# Patient Record
Sex: Male | Born: 1965 | Race: White | Hispanic: No | Smoking: Former smoker
Health system: Southern US, Community
[De-identification: ages and names within clinical notes are randomized; demographics above are authoritative.]

## PROBLEM LIST (undated history)

## (undated) DIAGNOSIS — I10 Essential (primary) hypertension: Secondary | ICD-10-CM

## (undated) DIAGNOSIS — E785 Hyperlipidemia, unspecified: Secondary | ICD-10-CM

## (undated) DIAGNOSIS — F329 Major depressive disorder, single episode, unspecified: Secondary | ICD-10-CM

## (undated) DIAGNOSIS — F419 Anxiety disorder, unspecified: Secondary | ICD-10-CM

## (undated) DIAGNOSIS — F32A Depression, unspecified: Secondary | ICD-10-CM

## (undated) HISTORY — DX: Major depressive disorder, single episode, unspecified: F32.9

## (undated) HISTORY — PX: JOINT REPLACEMENT: SHX530

## (undated) HISTORY — DX: Depression, unspecified: F32.A

## (undated) HISTORY — PX: TONSILLECTOMY: SUR1361

## (undated) HISTORY — DX: Hyperlipidemia, unspecified: E78.5

---

## 2012-10-25 ENCOUNTER — Other Ambulatory Visit: Payer: Self-pay | Admitting: Orthopedic Surgery

## 2012-10-25 MED ORDER — BUPIVACAINE LIPOSOME 1.3 % IJ SUSP: 20.0000 mL | Freq: Once | INTRAMUSCULAR | Status: DC

## 2012-10-25 MED ORDER — DEXAMETHASONE SODIUM PHOSPHATE 10 MG/ML IJ SOLN: 10.0000 mg | Freq: Once | INTRAMUSCULAR | Status: DC

## 2012-10-25 NOTE — Progress Notes (Signed)
Preoperative surgical orders have been place into the Epic hospital system for [facility] on 10/25/2012, 7:38 AM  by Patrica Duel for surgery on 12/27/12.  Preop Total Hip - Anterior Approach orders including Experel Injecion, IV Tylenol, and IV Decadron as long as there are no contraindications to the above medications. Avel Peace, PA-C

## 2012-12-13 ENCOUNTER — Encounter (HOSPITAL_COMMUNITY): Payer: Self-pay | Admitting: Pharmacy Technician

## 2012-12-19 ENCOUNTER — Encounter (HOSPITAL_COMMUNITY): Payer: Self-pay

## 2012-12-19 ENCOUNTER — Encounter (HOSPITAL_COMMUNITY)
Admission: RE | Admit: 2012-12-19 | Discharge: 2012-12-19 | Disposition: A | Discharge status: Home / Self Care | Payer: BC Managed Care – PPO | Source: Ambulatory Visit | Attending: Orthopedic Surgery | Admitting: Orthopedic Surgery

## 2012-12-19 DIAGNOSIS — Z01812 Encounter for preprocedural laboratory examination: Secondary | ICD-10-CM | POA: Insufficient documentation

## 2012-12-19 DIAGNOSIS — Z01818 Encounter for other preprocedural examination: Secondary | ICD-10-CM | POA: Insufficient documentation

## 2012-12-19 HISTORY — DX: Essential (primary) hypertension: I10

## 2012-12-19 HISTORY — DX: Anxiety disorder, unspecified: F41.9

## 2012-12-19 LAB — COMPREHENSIVE METABOLIC PANEL
Albumin: 4.4 g/dL (ref 3.5–5.2)
BUN: 10 mg/dL (ref 6–23)
CO2: 32 mEq/L (ref 19–32)
Calcium: 9.7 mg/dL (ref 8.4–10.5)
Chloride: 100 mEq/L (ref 96–112)
Creatinine, Ser: 0.95 mg/dL (ref 0.50–1.35)
GFR calc non Af Amer: >90 mL/min (ref >90)
Total Bilirubin: 0.4 mg/dL (ref 0.3–1.2)

## 2012-12-19 LAB — CBC
HCT: 42.1 % (ref 39.0–52.0)
MCH: 31.3 pg (ref 26.0–34.0)
MCV: 86.8 fL (ref 78.0–100.0)
RDW: 12.6 % (ref 11.5–15.5)
WBC: 7.7 10*3/uL (ref 4.0–10.5)

## 2012-12-19 LAB — PROTIME-INR
INR: 1.03 (ref 0.00–1.49)
Prothrombin Time: 13.4 seconds (ref 11.6–15.2)

## 2012-12-19 NOTE — Progress Notes (Addendum)
STATES HE HAD EKG AT DR. 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

## 2012-12-19 NOTE — Pre-Procedure Instructions (Signed)
Cameron Nelson  12/19/2012   Your procedure is scheduled on:  Friday, March 7th   Report to [facility] at  10:15  AM.  Call this number if you have problems the morning of surgery: [phone]   Remember:   Do not eat food or drink liquids after midnight Thursday.   Take these medicines the morning of surgery with A SIP OF WATER: Pain medication, ativan   Do not wear jewelry.  Do not wear lotions, powders, or colognes. You may NOT wear deodorant.    Men may shave face and neck.   Do not bring valuables to the hospital.  Contacts, dentures or bridgework may not be worn into surgery.   Leave suitcase in the car. After surgery it may be brought to your room.  For patients admitted to the hospital, checkout time is 11:00 AM the day of discharge.   Patients discharged the day of surgery will not be allowed to drive home.  Name and phone number of your driver: [name] --- SPOUSE   Special Instructions: Shower using CHG 2 nights before surgery and the night before surgery.  If you shower the day of surgery use CHG.  Use special wash - you have one bottle of CHG for all showers.  You should use approximately 1/3 of the bottle for each shower.   Please read over the following fact sheets that you were given: Pain Booklet, Coughing and Deep Breathing, Blood Transfusion Information, MRSA Information and Surgical Site Infection Prevention

## 2012-12-22 NOTE — H&P (Signed)
TOTAL HIP ADMISSION H&P  Patient is admitted for left total hip arthroplasty.  Subjective:  Chief Complaint: left hip pain  HPI: Cameron Nelson, 47 y.o. male, has a history of pain and functional disability in the left hip(s) due to arthritis and patient has failed non-surgical conservative treatments for greater than 12 weeks to include NSAID's and/or analgesics, corticosteriod injections, flexibility and strengthening excercises and activity modification.  Onset of symptoms was gradual starting 3 years ago with gradually worsening course since that time.The patient noted no past surgery on the left hip(s).  Patient currently rates pain in the left hip at 7 out of 10 with activity. Patient has night pain, worsening of pain with activity and weight bearing, pain that interfers with activities of daily living and pain with passive range of motion. Patient has evidence of periarticular osteophytes and joint space narrowing by imaging studies. This condition presents safety issues increasing the risk of falls. There is no current active infection.  Past Medical History  Diagnosis Date  . Hypertension   . Anxiety     Past Surgical History  Procedure Laterality Date  . Tonsillectomy       Current outpatient prescriptions benazepril (LOTENSIN) 20 MG tablet, Take 20 mg by mouth daily., Disp: , Rfl: ;  HYDROCODONE-ACETAMINOPHEN PO, Take 1 tablet by mouth at bedtime as needed (for pain). [facility] in [place] did not have a record of this med, Disp: , Rfl: ;   LORazepam (ATIVAN) 1 MG tablet, Take 1 mg by mouth every 8 (eight) hours., Disp: , Rfl: ;  rosuvastatin (CRESTOR) 10 MG tablet, Take 10 mg by mouth daily., Disp: , Rfl:  traMADol (ULTRAM) 50 MG tablet, Take 50 mg by mouth every 6 (six) hours as needed for pain. Patient uses 100 mg in the morning, and 100 mg in the evening., Disp: , Rfl: ;   Vortioxetine HBr (BRINTELLIX) 20 MG TABS, Take 20 mg by mouth daily., Disp: , Rfl:   No Known Allergies   History  Substance Use Topics  . Smoking status: Former Smoker -- 0.50 packs/day for 3 years    Types: Cigarettes  . Smokeless tobacco: Former Neurosurgeon    Quit date: 12/19/2004  . Alcohol Use: Not on file    Family History Father deceased age 20 due to Mi; triple CABG Mother living age 56; HTN Grandmother deceased due to CHF  Review of Systems  Constitutional: Negative.   HENT: Negative.  Negative for neck pain.   Eyes: Negative.   Respiratory: Negative.   Cardiovascular: Negative.   Gastrointestinal: Negative.   Genitourinary: Negative.   Musculoskeletal: Positive for back pain and joint pain. Negative for myalgias and falls.       Left hip pain  Skin: Positive for itching.       Medication side effect  Neurological: Negative.   Endo/Heme/Allergies: Negative.   Psychiatric/Behavioral: Negative.     Objective:  Physical Exam  Constitutional: He is oriented to person, place, and time. He appears well-developed and well-nourished. No distress.  HENT:  Head: Normocephalic and atraumatic.  Right Ear: External ear normal.  Left Ear: External ear normal.  Nose: Nose normal.  Mouth/Throat: Oropharynx is clear and moist.  Eyes: Conjunctivae are normal.  Neck: Normal range of motion. Neck supple. No tracheal deviation present. No thyromegaly present.  Cardiovascular: Normal rate, regular rhythm, normal heart sounds and intact distal pulses.   No murmur heard. Respiratory: Effort normal and breath sounds normal. No respiratory distress. He has no wheezes.  He exhibits no tenderness.  GI: Soft. Bowel sounds are normal. He exhibits no distension and no mass. There is no tenderness.  Musculoskeletal:       Right hip: He exhibits decreased range of motion.       Left hip: He exhibits decreased range of motion and decreased strength.       Right knee: Normal.       Left knee: Normal.       Right lower leg: He exhibits no tenderness and no swelling.       Left lower leg: He exhibits  no tenderness and no swelling.  Evaluation of his right hip is flexion to 95, rotation in 10, out 30, abduction 30 without discomfort. Left hip flexion 80, no internal or external rotation and about 5 degrees abduction. Knee exams are normal. He walks with a significantly antalgic gait on the left hip.  Lymphadenopathy:    He has no cervical adenopathy.  Neurological: He is alert and oriented to person, place, and time. He has normal strength and normal reflexes. No sensory deficit.  Skin: No rash noted. He is not diaphoretic. No erythema.  Psychiatric: He has a normal mood and affect. His behavior is normal.   Vitals Weight: 212 lb Height: 74 in Body Surface Area: 2.24 m Body Mass Index: 27.22 kg/m Pulse: 64 (Regular) Resp.: 16 (Unlabored) BP: 124/78 (Sitting, Left Arm, Standard)   Imaging Review Plain radiographs demonstrate severe degenerative joint disease of the left hip(s). The bone quality appears to be good for age and reported activity level.  Assessment/Plan:  End stage arthritis, left hip(s)  The patient history, physical examination, clinical judgement of the provider and imaging studies are consistent with end stage degenerative joint disease of the left hip(s) and total hip arthroplasty is deemed medically necessary. The treatment options including medical management, injection therapy, arthroscopy and arthroplasty were discussed at length. The risks and benefits of total hip arthroplasty were presented and reviewed. The risks due to aseptic loosening, infection, stiffness, dislocation/subluxation,  thromboembolic complications and other imponderables were discussed.  The patient acknowledged the explanation, agreed to proceed with the plan and consent was signed. Patient is being admitted for inpatient treatment for surgery, pain control, PT, OT, prophylactic antibiotics, VTE prophylaxis, progressive ambulation and ADL's and discharge planning.The patient is  planning to be discharged home with home health services     [place], [place]

## 2012-12-26 MED ORDER — CEFAZOLIN SODIUM-DEXTROSE 2-3 GM-% IV SOLR
2.0000 g | INTRAVENOUS | Status: AC
  Administered 2012-12-27: 2 g via INTRAVENOUS

## 2012-12-27 ENCOUNTER — Inpatient Hospital Stay (HOSPITAL_COMMUNITY): Payer: BC Managed Care – PPO

## 2012-12-27 ENCOUNTER — Encounter (HOSPITAL_COMMUNITY): Payer: Self-pay | Admitting: Vascular Surgery

## 2012-12-27 ENCOUNTER — Encounter (HOSPITAL_COMMUNITY): Payer: Self-pay

## 2012-12-27 ENCOUNTER — Encounter (HOSPITAL_COMMUNITY)
Admission: RE | Disposition: A | Discharge status: Home Health | Payer: Self-pay | Source: Ambulatory Visit | Attending: Orthopedic Surgery

## 2012-12-27 ENCOUNTER — Inpatient Hospital Stay (HOSPITAL_COMMUNITY): Payer: BC Managed Care – PPO | Admitting: Anesthesiology

## 2012-12-27 ENCOUNTER — Inpatient Hospital Stay (HOSPITAL_COMMUNITY)
Admission: RE | Admit: 2012-12-27 | Discharge: 2012-12-29 | DRG: 818 | Disposition: A | Discharge status: Home Health | Payer: BC Managed Care – PPO | Source: Ambulatory Visit | Attending: Orthopedic Surgery | Admitting: Orthopedic Surgery

## 2012-12-27 DIAGNOSIS — I10 Essential (primary) hypertension: Secondary | ICD-10-CM | POA: Diagnosis present

## 2012-12-27 DIAGNOSIS — Z79899 Other long term (current) drug therapy: Secondary | ICD-10-CM

## 2012-12-27 DIAGNOSIS — F411 Generalized anxiety disorder: Secondary | ICD-10-CM | POA: Diagnosis present

## 2012-12-27 DIAGNOSIS — Z87891 Personal history of nicotine dependence: Secondary | ICD-10-CM

## 2012-12-27 DIAGNOSIS — Z8249 Family history of ischemic heart disease and other diseases of the circulatory system: Secondary | ICD-10-CM

## 2012-12-27 DIAGNOSIS — M169 Osteoarthritis of hip, unspecified: Secondary | ICD-10-CM | POA: Diagnosis present

## 2012-12-27 DIAGNOSIS — F1210 Cannabis abuse, uncomplicated: Secondary | ICD-10-CM | POA: Diagnosis present

## 2012-12-27 DIAGNOSIS — Z9089 Acquired absence of other organs: Secondary | ICD-10-CM

## 2012-12-27 DIAGNOSIS — M1610 Unilateral primary osteoarthritis, unspecified hip: Principal | ICD-10-CM | POA: Diagnosis present

## 2012-12-27 HISTORY — PX: TOTAL HIP ARTHROPLASTY: SHX124

## 2012-12-27 SURGERY — ARTHROPLASTY, HIP, TOTAL, ANTERIOR APPROACH
Anesthesia: General | Site: Hip | Laterality: Left | Wound class: Clean

## 2012-12-27 MED ORDER — SODIUM CHLORIDE 0.9 % IV SOLN: INTRAVENOUS | Status: DC

## 2012-12-27 MED ORDER — KCL IN DEXTROSE-NACL 20-5-0.45 MEQ/L-%-% IV SOLN
INTRAVENOUS | Status: AC
  Administered 2012-12-27: 1000 mL
  Filled 2012-12-27: qty 1000

## 2012-12-27 MED ORDER — DEXAMETHASONE 6 MG PO TABS
10.0000 mg | ORAL_TABLET | Freq: Once | ORAL | Status: AC
  Administered 2012-12-28: 10 mg via ORAL
  Filled 2012-12-27: qty 1

## 2012-12-27 MED ORDER — ONDANSETRON HCL 4 MG/2ML IJ SOLN
INTRAMUSCULAR | Status: DC | PRN
  Administered 2012-12-27: 4 mg via INTRAVENOUS

## 2012-12-27 MED ORDER — 0.9 % SODIUM CHLORIDE (POUR BTL) OPTIME
TOPICAL | Status: DC | PRN
  Administered 2012-12-27: 1000 mL

## 2012-12-27 MED ORDER — ACETAMINOPHEN 10 MG/ML IV SOLN
1000.0000 mg | Freq: Once | INTRAVENOUS | Status: DC
  Filled 2012-12-27: qty 100

## 2012-12-27 MED ORDER — FENTANYL CITRATE 0.05 MG/ML IJ SOLN
INTRAMUSCULAR | Status: DC | PRN
  Administered 2012-12-27 (×2): 50 ug via INTRAVENOUS
  Administered 2012-12-27: 100 ug via INTRAVENOUS
  Administered 2012-12-27: 150 ug via INTRAVENOUS
  Administered 2012-12-27 (×2): 50 ug via INTRAVENOUS

## 2012-12-27 MED ORDER — VORTIOXETINE HBR 20 MG PO TABS: 20.0000 mg | ORAL_TABLET | Freq: Every day | ORAL | Status: DC

## 2012-12-27 MED ORDER — CEFAZOLIN SODIUM 1-5 GM-% IV SOLN
INTRAVENOUS | Status: AC
  Filled 2012-12-27: qty 100

## 2012-12-27 MED ORDER — METHOCARBAMOL 100 MG/ML IJ SOLN
500.0000 mg | Freq: Four times a day (QID) | INTRAVENOUS | Status: DC | PRN
  Filled 2012-12-27: qty 5

## 2012-12-27 MED ORDER — SODIUM CHLORIDE 0.9 % IJ SOLN
INTRAMUSCULAR | Status: DC | PRN
  Administered 2012-12-27: 50 mL

## 2012-12-27 MED ORDER — HYDROMORPHONE HCL PF 1 MG/ML IJ SOLN
INTRAMUSCULAR | Status: AC
  Filled 2012-12-27: qty 1

## 2012-12-27 MED ORDER — ACETAMINOPHEN 325 MG PO TABS: 650.0000 mg | ORAL_TABLET | Freq: Four times a day (QID) | ORAL | Status: DC | PRN

## 2012-12-27 MED ORDER — ONDANSETRON HCL 4 MG/2ML IJ SOLN: 4.0000 mg | Freq: Four times a day (QID) | INTRAMUSCULAR | Status: DC | PRN

## 2012-12-27 MED ORDER — LACTATED RINGERS IV SOLN
INTRAVENOUS | Status: DC
  Administered 2012-12-27: 12:00:00 via INTRAVENOUS

## 2012-12-27 MED ORDER — DOCUSATE SODIUM 100 MG PO CAPS
100.0000 mg | ORAL_CAPSULE | Freq: Two times a day (BID) | ORAL | Status: DC
  Administered 2012-12-27 – 2012-12-29 (×4): 100 mg via ORAL
  Filled 2012-12-27 (×6): qty 1

## 2012-12-27 MED ORDER — MORPHINE SULFATE 2 MG/ML IJ SOLN
1.0000 mg | INTRAMUSCULAR | Status: DC | PRN
  Administered 2012-12-27 – 2012-12-28 (×2): 2 mg via INTRAVENOUS
  Filled 2012-12-27 (×2): qty 1

## 2012-12-27 MED ORDER — FENTANYL CITRATE 0.05 MG/ML IJ SOLN: 50.0000 ug | Freq: Once | INTRAMUSCULAR | Status: DC

## 2012-12-27 MED ORDER — HYDROMORPHONE HCL PF 1 MG/ML IJ SOLN
0.2500 mg | INTRAMUSCULAR | Status: DC | PRN
  Administered 2012-12-27 (×4): 0.5 mg via INTRAVENOUS

## 2012-12-27 MED ORDER — ACETAMINOPHEN 650 MG RE SUPP: 650.0000 mg | Freq: Four times a day (QID) | RECTAL | Status: DC | PRN

## 2012-12-27 MED ORDER — DEXTROSE 5 % IV SOLN
INTRAVENOUS | Status: DC | PRN
  Administered 2012-12-27 (×2): via INTRAVENOUS

## 2012-12-27 MED ORDER — MIDAZOLAM HCL 2 MG/2ML IJ SOLN: 1.0000 mg | INTRAMUSCULAR | Status: DC | PRN

## 2012-12-27 MED ORDER — ACETAMINOPHEN 10 MG/ML IV SOLN
INTRAVENOUS | Status: AC
  Filled 2012-12-27: qty 100

## 2012-12-27 MED ORDER — GLYCOPYRROLATE 0.2 MG/ML IJ SOLN
INTRAMUSCULAR | Status: DC | PRN
  Administered 2012-12-27: 0.4 mg via INTRAVENOUS

## 2012-12-27 MED ORDER — BUPIVACAINE LIPOSOME 1.3 % IJ SUSP
20.0000 mL | Freq: Once | INTRAMUSCULAR | Status: AC
  Administered 2012-12-27: 20 mL
  Filled 2012-12-27: qty 20

## 2012-12-27 MED ORDER — PHENOL 1.4 % MT LIQD: 1.0000 | OROMUCOSAL | Status: DC | PRN

## 2012-12-27 MED ORDER — BISACODYL 10 MG RE SUPP: 10.0000 mg | Freq: Every day | RECTAL | Status: DC | PRN

## 2012-12-27 MED ORDER — RIVAROXABAN 10 MG PO TABS
10.0000 mg | ORAL_TABLET | Freq: Every day | ORAL | Status: DC
  Administered 2012-12-28: 10 mg via ORAL
  Filled 2012-12-27 (×3): qty 1

## 2012-12-27 MED ORDER — PROPOFOL 10 MG/ML IV BOLUS
INTRAVENOUS | Status: DC | PRN
  Administered 2012-12-27: 200 mg via INTRAVENOUS

## 2012-12-27 MED ORDER — PROMETHAZINE HCL 25 MG/ML IJ SOLN: 6.2500 mg | INTRAMUSCULAR | Status: DC | PRN

## 2012-12-27 MED ORDER — LABETALOL HCL 5 MG/ML IV SOLN
INTRAVENOUS | Status: DC | PRN
  Administered 2012-12-27: 10 mg via INTRAVENOUS
  Administered 2012-12-27 (×2): 5 mg via INTRAVENOUS

## 2012-12-27 MED ORDER — METHOCARBAMOL 500 MG PO TABS
500.0000 mg | ORAL_TABLET | Freq: Four times a day (QID) | ORAL | Status: DC | PRN
  Administered 2012-12-27 – 2012-12-29 (×7): 500 mg via ORAL
  Filled 2012-12-27 (×7): qty 1

## 2012-12-27 MED ORDER — LACTATED RINGERS IV SOLN
INTRAVENOUS | Status: DC | PRN
  Administered 2012-12-27 (×3): via INTRAVENOUS

## 2012-12-27 MED ORDER — CHLORHEXIDINE GLUCONATE 4 % EX LIQD: 60.0000 mL | Freq: Once | CUTANEOUS | Status: DC

## 2012-12-27 MED ORDER — METOCLOPRAMIDE HCL 10 MG PO TABS: 5.0000 mg | ORAL_TABLET | Freq: Three times a day (TID) | ORAL | Status: DC | PRN

## 2012-12-27 MED ORDER — BENAZEPRIL HCL 20 MG PO TABS
20.0000 mg | ORAL_TABLET | Freq: Every day | ORAL | Status: DC
  Administered 2012-12-27 – 2012-12-29 (×3): 20 mg via ORAL
  Filled 2012-12-27 (×3): qty 1

## 2012-12-27 MED ORDER — OXYCODONE HCL 5 MG PO TABS
5.0000 mg | ORAL_TABLET | ORAL | Status: DC | PRN
  Administered 2012-12-28 – 2012-12-29 (×9): 10 mg via ORAL
  Filled 2012-12-27 (×9): qty 2

## 2012-12-27 MED ORDER — VORTIOXETINE HBR 20 MG PO TABS
20.0000 mg | ORAL_TABLET | Freq: Every day | ORAL | Status: DC
  Filled 2012-12-27 (×2): qty 1

## 2012-12-27 MED ORDER — ACETAMINOPHEN 10 MG/ML IV SOLN
1000.0000 mg | Freq: Four times a day (QID) | INTRAVENOUS | Status: AC
  Administered 2012-12-27 – 2012-12-28 (×4): 1000 mg via INTRAVENOUS
  Filled 2012-12-27 (×4): qty 100

## 2012-12-27 MED ORDER — LIDOCAINE HCL 4 % MT SOLN
OROMUCOSAL | Status: DC | PRN
  Administered 2012-12-27: 4 mL via TOPICAL

## 2012-12-27 MED ORDER — NEOSTIGMINE METHYLSULFATE 1 MG/ML IJ SOLN
INTRAMUSCULAR | Status: DC | PRN
  Administered 2012-12-27: 3 mg via INTRAVENOUS

## 2012-12-27 MED ORDER — DEXAMETHASONE SODIUM PHOSPHATE 10 MG/ML IJ SOLN
10.0000 mg | Freq: Once | INTRAMUSCULAR | Status: AC
  Filled 2012-12-27: qty 1

## 2012-12-27 MED ORDER — MIDAZOLAM HCL 5 MG/5ML IJ SOLN
INTRAMUSCULAR | Status: DC | PRN
  Administered 2012-12-27: 2 mg via INTRAVENOUS

## 2012-12-27 MED ORDER — LORAZEPAM 1 MG PO TABS
1.0000 mg | ORAL_TABLET | Freq: Three times a day (TID) | ORAL | Status: DC
  Administered 2012-12-27 – 2012-12-29 (×5): 1 mg via ORAL
  Filled 2012-12-27 (×6): qty 1

## 2012-12-27 MED ORDER — ONDANSETRON HCL 4 MG PO TABS: 4.0000 mg | ORAL_TABLET | Freq: Four times a day (QID) | ORAL | Status: DC | PRN

## 2012-12-27 MED ORDER — KCL IN DEXTROSE-NACL 20-5-0.9 MEQ/L-%-% IV SOLN
INTRAVENOUS | Status: DC
  Administered 2012-12-28: 04:00:00 via INTRAVENOUS
  Filled 2012-12-27 (×7): qty 1000

## 2012-12-27 MED ORDER — ATORVASTATIN CALCIUM 20 MG PO TABS
20.0000 mg | ORAL_TABLET | Freq: Every day | ORAL | Status: DC
  Administered 2012-12-27 – 2012-12-28 (×2): 20 mg via ORAL
  Filled 2012-12-27 (×3): qty 1

## 2012-12-27 MED ORDER — ROCURONIUM BROMIDE 100 MG/10ML IV SOLN
INTRAVENOUS | Status: DC | PRN
  Administered 2012-12-27: 50 mg via INTRAVENOUS

## 2012-12-27 MED ORDER — ACETAMINOPHEN 10 MG/ML IV SOLN
INTRAVENOUS | Status: DC | PRN
  Administered 2012-12-27: 1000 mg via INTRAVENOUS

## 2012-12-27 MED ORDER — FLEET ENEMA 7-19 GM/118ML RE ENEM: 1.0000 | ENEMA | Freq: Once | RECTAL | Status: AC | PRN

## 2012-12-27 MED ORDER — DIPHENHYDRAMINE HCL 12.5 MG/5ML PO ELIX: 12.5000 mg | ORAL_SOLUTION | ORAL | Status: DC | PRN

## 2012-12-27 MED ORDER — POLYETHYLENE GLYCOL 3350 17 G PO PACK: 17.0000 g | PACK | Freq: Every day | ORAL | Status: DC | PRN

## 2012-12-27 MED ORDER — METOCLOPRAMIDE HCL 5 MG/ML IJ SOLN: 5.0000 mg | Freq: Three times a day (TID) | INTRAMUSCULAR | Status: DC | PRN

## 2012-12-27 MED ORDER — LIDOCAINE HCL (CARDIAC) 20 MG/ML IV SOLN
INTRAVENOUS | Status: DC | PRN
  Administered 2012-12-27: 100 mg via INTRAVENOUS

## 2012-12-27 MED ORDER — TRAMADOL HCL 50 MG PO TABS: 50.0000 mg | ORAL_TABLET | Freq: Four times a day (QID) | ORAL | Status: DC | PRN

## 2012-12-27 MED ORDER — MORPHINE SULFATE 2 MG/ML IJ SOLN
INTRAMUSCULAR | Status: AC
  Administered 2012-12-27: 2 mg via INTRAMUSCULAR
  Filled 2012-12-27: qty 1

## 2012-12-27 MED ORDER — MENTHOL 3 MG MT LOZG: 1.0000 | LOZENGE | OROMUCOSAL | Status: DC | PRN

## 2012-12-27 MED ORDER — CEFAZOLIN SODIUM-DEXTROSE 2-3 GM-% IV SOLR
2.0000 g | Freq: Four times a day (QID) | INTRAVENOUS | Status: AC
  Administered 2012-12-27 – 2012-12-28 (×2): 2 g via INTRAVENOUS
  Filled 2012-12-27 (×2): qty 50

## 2012-12-27 SURGICAL SUPPLY — 40 items
BLADE SAW SGTL 18X1.27X75 (BLADE) ×2 IMPLANT
CLOSURE STERI-STRIP 1/4X4 (GAUZE/BANDAGES/DRESSINGS) ×2 IMPLANT
CLOTH BEACON ORANGE TIMEOUT ST (SAFETY) ×2 IMPLANT
CLSR STERI-STRIP ANTIMIC 1/2X4 (GAUZE/BANDAGES/DRESSINGS) ×4 IMPLANT
DECANTER SPIKE VIAL GLASS SM (MISCELLANEOUS) ×2 IMPLANT
DRAPE C-ARM 42X72 X-RAY (DRAPES) ×2 IMPLANT
DRAPE STERI IOBAN 125X83 (DRAPES) ×2 IMPLANT
DRAPE U-SHAPE 47X51 STRL (DRAPES) ×6 IMPLANT
DRSG ADAPTIC 3X8 NADH LF (GAUZE/BANDAGES/DRESSINGS) ×2 IMPLANT
DRSG MEPILEX BORDER 4X4 (GAUZE/BANDAGES/DRESSINGS) IMPLANT
DRSG MEPILEX BORDER 4X8 (GAUZE/BANDAGES/DRESSINGS) ×2 IMPLANT
DURAPREP 26ML APPLICATOR (WOUND CARE) ×2 IMPLANT
ELECT BLADE 6.5 EXT (BLADE) ×2 IMPLANT
ELECT REM PT RETURN 9FT ADLT (ELECTROSURGICAL) ×2
ELECTRODE REM PT RTRN 9FT ADLT (ELECTROSURGICAL) ×1 IMPLANT
EVACUATOR 1/8 PVC DRAIN (DRAIN) IMPLANT
FACESHIELD LNG OPTICON STERILE (SAFETY) ×8 IMPLANT
GLOVE BIO SURGEON STRL SZ8 (GLOVE) ×4 IMPLANT
GLOVE BIOGEL PI IND STRL 8 (GLOVE) ×1 IMPLANT
GLOVE BIOGEL PI INDICATOR 8 (GLOVE) ×1
GLOVE ECLIPSE 8.0 STRL XLNG CF (GLOVE) ×2 IMPLANT
GOWN BRE IMP PREV XXLGXLNG (GOWN DISPOSABLE) ×4 IMPLANT
GOWN STRL NON-REIN LRG LVL3 (GOWN DISPOSABLE) ×2 IMPLANT
KIT BASIN OR (CUSTOM PROCEDURE TRAY) ×2 IMPLANT
NDL SAFETY ECLIPSE 18X1.5 (NEEDLE) ×1 IMPLANT
NEEDLE HYPO 18GX1.5 SHARP (NEEDLE) ×1
PACK TOTAL JOINT (CUSTOM PROCEDURE TRAY) ×2 IMPLANT
PADDING CAST COTTON 6X4 STRL (CAST SUPPLIES) ×2 IMPLANT
SPONGE GAUZE 4X4 12PLY (GAUZE/BANDAGES/DRESSINGS) ×2 IMPLANT
SUCTION FRAZIER TIP 10 FR DISP (SUCTIONS) ×2 IMPLANT
SUT ETHIBOND NAB CT1 #1 30IN (SUTURE) ×6 IMPLANT
SUT MNCRL AB 4-0 PS2 18 (SUTURE) ×2 IMPLANT
SUT VIC AB 1 CT1 27 (SUTURE) ×1
SUT VIC AB 1 CT1 27XBRD ANTBC (SUTURE) ×1 IMPLANT
SUT VIC AB 2-0 CT1 27 (SUTURE) ×2
SUT VIC AB 2-0 CT1 TAPERPNT 27 (SUTURE) ×2 IMPLANT
SUT VLOC 180 0 24IN GS25 (SUTURE) ×2 IMPLANT
SYR 50ML LL SCALE MARK (SYRINGE) ×2 IMPLANT
TOWEL OR 17X26 10 PK STRL BLUE (TOWEL DISPOSABLE) ×4 IMPLANT
TRAY FOLEY CATH 14FRSI W/METER (CATHETERS) ×2 IMPLANT

## 2012-12-27 NOTE — Evaluation (Signed)
Physical Therapy Evaluation Patient Details Name: Cameron Nelson MRN: [id number] DOB: 1966/10/19 Today's Date: 12/27/2012 Time: 1324-4010 PT Time Calculation (min): 19 min  PT Assessment / Plan / Recommendation Clinical Impression  Patient is a 47 yo male s/p Lt. THA.  Session limited by nausea today.  Will benefit from acute PT to maximize independence prior to return home with family.  Recommend HHPT at discharge for follow-up therapy.    PT Assessment  Patient needs continued PT services    Follow Up Recommendations  Home health PT;Supervision/Assistance - 24 hour    Does the patient have the potential to tolerate intense rehabilitation      Barriers to Discharge None      Equipment Recommendations  Rolling walker with 5" wheels (3-in-1 BSC)    Recommendations for Other Services     Frequency 7X/week    Precautions / Restrictions Precautions Precautions: None Restrictions Weight Bearing Restrictions: Yes LLE Weight Bearing: Weight bearing as tolerated   Pertinent Vitals/Pain Pain and nausea limiting session today.      Mobility  Bed Mobility Bed Mobility: Supine to Sit;Sitting - Scoot to Edge of Bed;Sit to Supine Supine to Sit: 4: Min assist;HOB elevated Sitting - Scoot to Edge of Bed: 4: Min guard;With rail Sit to Supine: 4: Min assist;HOB elevated Details for Bed Mobility Assistance: Verbal cues for technique.  Assist to move LLE off of and onto bed.  Patient sat at EOB x 6 minutes with good balance.  Patient became nauseated - returned to supine. Transfers Transfers: Not assessed    Exercises Total Joint Exercises Ankle Circles/Pumps: AROM;Both;10 reps;Supine   PT Diagnosis: Difficulty walking;Acute pain  PT Problem List: Decreased strength;Decreased range of motion;Decreased activity tolerance;Decreased mobility;Decreased knowledge of use of DME;Pain PT Treatment Interventions: DME instruction;Gait training;Stair training;Functional mobility  training;Therapeutic exercise;Patient/family education   PT Goals Acute Rehab PT Goals PT Goal Formulation: With patient Time For Goal Achievement: 01/03/13 Potential to Achieve Goals: Good Pt will go Supine/Side to Sit: Independently;with HOB 0 degrees PT Goal: Supine/Side to Sit - Progress: Goal set today Pt will go Sit to Supine/Side: Independently;with HOB 0 degrees PT Goal: Sit to Supine/Side - Progress: Goal set today Pt will go Sit to Stand: with supervision;with upper extremity assist PT Goal: Sit to Stand - Progress: Goal set today Pt will Ambulate: 51 - 150 feet;with supervision;with rolling walker PT Goal: Ambulate - Progress: Goal set today Pt will Go Up / Down Stairs: Flight;with min assist;with rail(s);with least restrictive assistive device PT Goal: Up/Down Stairs - Progress: Goal set today Pt will Perform Home Exercise Program: Independently PT Goal: Perform Home Exercise Program - Progress: Goal set today  Visit Information  Last PT Received On: 12/27/12 Assistance Needed: +1    Subjective Data  Subjective: "I feel a little nauseated"  Patient reported in sitting Patient Stated Goal: To return home   Prior Functioning  Home Living Lives With: Spouse;Family Available Help at Discharge: Family;Available 24 hours/day Type of Home: House Home Access: Stairs to enter [employer] of Steps: 3 Entrance Stairs-Rails: Right;Left Home Layout: Multi-level;Bed/bath upstairs Alternate Level Stairs-Number of Steps: flight Alternate Level Stairs-Rails: Right Bathroom Shower/Tub: Engineer, manufacturing systems: Standard Home Adaptive Equipment: None (Reports wife is "working on Armed forces training and education officer") Prior Function Level of Independence: Independent Able to Take Stairs?: Yes Driving: Yes Vocation: Full time employment Communication Communication: No difficulties    Cognition  Cognition Overall Cognitive Status: Appears within functional limits for tasks  assessed/performed Arousal/Alertness: Lethargic Orientation Level: Appears  intact for tasks assessed Behavior During Session: Lethargic Cognition - Other Comments: Lethargic - pain meds    Extremity/Trunk Assessment Right Upper Extremity Assessment RUE ROM/Strength/Tone: WFL for tasks assessed RUE Sensation: WFL - Light Touch Left Upper Extremity Assessment LUE ROM/Strength/Tone: WFL for tasks assessed LUE Sensation: WFL - Light Touch Right Lower Extremity Assessment RLE ROM/Strength/Tone: WFL for tasks assessed RLE Sensation: WFL - Light Touch Left Lower Extremity Assessment LLE ROM/Strength/Tone: Deficits;Unable to fully assess;Due to pain LLE ROM/Strength/Tone Deficits: Able to assist moving LLE off  of and onto bed. Trunk Assessment Trunk Assessment: Normal   Balance Balance Balance Assessed: Yes Static Sitting Balance Static Sitting - Balance Support: No upper extremity supported;Feet supported Static Sitting - Level of Assistance: 5: Stand by assistance Static Sitting - Comment/# of Minutes: 6 minutes - returned to supine due to nausea  End of Session PT - End of Session Equipment Utilized During Treatment: Oxygen (RN notified - pulse oximeter not working properly) Activity Tolerance: Patient limited by pain;Treatment limited secondary to medical complications (Comment) (Limited by nausea) Patient left: in bed;with call bell/phone within reach Nurse Communication: Mobility status (Pulse oximeter not functioning)  GP     Vena Austria 12/27/2012, 7:12 PM Durenda Hurt. Renaldo Fiddler, [facility] Acute Rehab Services Pager [phone]

## 2012-12-27 NOTE — Interval H&P Note (Signed)
History and Physical Interval Note:  12/27/2012 11:56 AM  Cameron Nelson  has presented today for surgery, with the diagnosis of Osteoarthritis of Left Hip  The various methods of treatment have been discussed with the patient and family. After consideration of risks, benefits and other options for treatment, the patient has consented to  Procedure(s): TOTAL HIP ARTHROPLASTY ANTERIOR APPROACH (Left) as a surgical intervention .  The patient's history has been reviewed, patient examined, no change in status, stable for surgery.  I have reviewed the patient's chart and labs.  Questions were answered to the patient's satisfaction.     Loanne Drilling

## 2012-12-27 NOTE — Anesthesia Preprocedure Evaluation (Signed)
Anesthesia Evaluation   Patient awake    Reviewed: Allergy & Precautions, H&P , NPO status , Patient's Chart, lab work & pertinent test results  Airway Mallampati: I TM Distance: >3 FB Neck ROM: Full    Dental   Pulmonary former smoker,  breath sounds clear to auscultation        Cardiovascular hypertension, Rhythm:Regular Rate:Normal     Neuro/Psych    GI/Hepatic (+)     substance abuse  marijuana use,   Endo/Other    Renal/GU      Musculoskeletal   Abdominal   Peds  Hematology   Anesthesia Other Findings   Reproductive/Obstetrics                           Anesthesia Physical Anesthesia Plan  ASA: II  Anesthesia Plan: General   Post-op Pain Management:    Induction: Intravenous  Airway Management Planned: Oral ETT  Additional Equipment:   Intra-op Plan:   Post-operative Plan: Extubation in OR  Informed Consent: I have reviewed the patients History and Physical, chart, labs and discussed the procedure including the risks, benefits and alternatives for the proposed anesthesia with the patient or authorized representative who has indicated his/her understanding and acceptance.     Plan Discussed with: CRNA and Surgeon  Anesthesia Plan Comments:         Anesthesia Quick Evaluation

## 2012-12-27 NOTE — Preoperative (Signed)
Beta Blockers   Reason not to administer Beta Blockers:Not Applicable 

## 2012-12-27 NOTE — Op Note (Signed)
OPERATIVE REPORT  PREOPERATIVE DIAGNOSIS: Osteoarthritis of the Left hip.   POSTOPERATIVE DIAGNOSIS: Osteoarthritis of the Left  hip.   PROCEDURE: Left total hip arthroplasty, anterior approach.   SURGEON: Ollen Gross, MD   ASSISTANT: Avel Peace, PA-C  ANESTHESIA:  General  ESTIMATED BLOOD LOSS:- 500 ml  DRAINS: Hemovac x1.   COMPLICATIONS: None   CONDITION: PACU - hemodynamically stable.   BRIEF CLINICAL NOTE: Cameron Nelson is a 47 y.o. male who has advanced end-  stage arthritis of his Left  hip with progressively worsening pain and  dysfunction.The patient has failed nonoperative management and presents for  total hip arthroplasty.   PROCEDURE IN DETAIL: After successful administration of spinal  anesthetic, the traction boots for the [facility] bed were placed on both  feet and the patient was placed onto the [facility] bed, boots placed into the leg  holders. The Left hip was then isolated from the perineum with plastic  drapes and prepped and draped in the usual sterile fashion. ASIS and  greater trochanter were marked and a oblique incision was made, starting  at about 1 cm lateral and 2 cm distal to the ASIS and coursing towards  the anterior cortex of the femur. The skin was cut with a 10 blade  through subcutaneous tissue to the level of the fascia overlying the  tensor fascia lata muscle. The fascia was then incised in line with the  incision at the junction of the anterior third and posterior 2/3rd. The  muscle was teased off the fascia and then the interval between the TFL  and the rectus was developed. The Hohmann retractor was then placed at  the top of the femoral neck over the capsule. The vessels overlying the  capsule were cauterized and the fat on top of the capsule was removed.  A Hohmann retractor was then placed anterior underneath the rectus  femoris to give exposure to the entire anterior capsule. A T-shaped  capsulotomy was performed. The  edges were tagged and the femoral head  was identified.       Osteophytes are removed off the superior acetabulum.  The femoral neck was then cut in situ with an oscillating saw. Traction  was then applied to the left lower extremity utilizing the [facility]  traction. The femoral head was then removed. Retractors were placed  around the acetabulum and then circumferential removal of the labrum was  performed. Osteophytes were also removed. Reaming starts at 47 mm to  medialize and  Increased in 2 mm increments to 55 mm. We reamed in  approximately 40 degrees of abduction, 20 degrees anteversion. A 56 mm  pinnacle acetabular shell was then impacted in anatomic position under  fluoroscopic guidance with excellent purchase. We did not need to place  any additional dome screws. A 36 mm neutral + 4 marathon liner was then  placed into the acetabular shell.       The femoral lift was then placed along the lateral aspect of the femur  just distal to the vastus ridge. The leg was  externally rotated and capsule  was stripped off the inferior aspect of the femoral neck down to the  level of the lesser trochanter, this was done with electrocautery. The femur was lifted after this was performed. The  leg was then placed and extended in adducted position to essentially delivering the femur. We also removed the capsule superiorly and the  piriformis from the piriformis fossa  to gain excellent exposure of the  proximal femur. Rongeur was used to remove some cancellous bone to get  into the lateral portion of the proximal femur for placement of the  initial starter reamer. The starter broaches was placed  the starter broach  and was shown to go down the center of the canal. Broaching  with the  Corail system was then performed starting at size 8, coursing  Up to size 15. A size 15 had excellent torsional and rotational  and axial stability. The trial standard offset neck was then placed  with a 36 + 8.5 trial  head. The hip was then reduced. We confirmed that  the stem was in the canal both on AP and lateral x-rays. It also has excellent sizing. The hip was reduced with outstanding stability through full extension, full external rotation,  and then flexion in adduction internal rotation. AP pelvis was taken  and the leg lengths were measured and found to be exactly equal. Hip  was then dislocated again and the femoral head and neck removed. The  femoral broach was removed. Size 15 Corail stem with a standard offset  neck was then impacted into the femur following native anteversion. Has  excellent purchase in the canal. Excellent torsional and rotational and  axial stability. It is confirmed to be in the canal on AP and lateral  fluoroscopic views. The 36 + 8.5 ceramic head was placed and the hip  reduced with outstanding stability. Again AP pelvis was taken and it  confirmed that the leg lengths were equal. The wound was then copiously  irrigated with saline solution and the capsule reattached and repaired  with Ethibond suture.  20 mL of Exparel mixed with 50 mL of saline injected  into the capsule and into the edge of the tensor fascia lata as well as  subcutaneous tissue. The fascia overlying the tensor fascia lata was  then closed with a running #1 V-Loc. Subcu was closed with interrupted  2-0 Vicryl and subcuticular running 4-0 Monocryl. Incision was cleaned  and dried. Steri-Strips and a bulky sterile dressing applied. Hemovac  drain was hooked to suction and then he was awakened and transported to  recovery in stable condition.        Please note that a surgical assistant was a medical necessity for this procedure to perform it in a safe and expeditious manner. Assistant was necessary to provide appropriate retraction of vital neurovascular structures and to prevent femoral fracture and allow for anatomic placement of the prosthesis.  Ollen Gross, M.D.

## 2012-12-27 NOTE — Transfer of Care (Signed)
Immediate Anesthesia Transfer of Care Note  Patient: Cameron Nelson  Procedure(s) Performed: Procedure(s): TOTAL HIP ARTHROPLASTY ANTERIOR APPROACH (Left)  Patient Location: PACU  Anesthesia Type:General  Level of Consciousness: awake, alert , oriented, patient cooperative and responds to stimulation  Airway & Oxygen Therapy: Patient Spontanous Breathing and Patient connected to nasal cannula oxygen  Post-op Assessment: Report given to PACU RN and Post -op Vital signs reviewed and stable  Post vital signs: Reviewed and stable  Complications: No apparent anesthesia complications

## 2012-12-27 NOTE — Progress Notes (Signed)
1545 pelvis xrays done

## 2012-12-27 NOTE — Anesthesia Postprocedure Evaluation (Signed)
  Anesthesia Post-op Note  Patient: Cameron Nelson  Procedure(s) Performed: Procedure(s): TOTAL HIP ARTHROPLASTY ANTERIOR APPROACH (Left)  Patient Location: PACU  Anesthesia Type:General  Level of Consciousness: awake  Airway and Oxygen Therapy: Patient Spontanous Breathing  Post-op Pain: mild  Post-op Assessment: Post-op Vital signs reviewed, Patient's Cardiovascular Status Stable, Respiratory Function Stable, Patent Airway, No signs of Nausea or vomiting and Pain level controlled  Post-op Vital Signs: stable  Complications: No apparent anesthesia complications

## 2012-12-28 LAB — BASIC METABOLIC PANEL
CO2: 28 mEq/L (ref 19–32)
Chloride: 101 mEq/L (ref 96–112)
GFR calc Af Amer: >90 mL/min (ref >90)
Potassium: 4 mEq/L (ref 3.5–5.1)

## 2012-12-28 LAB — CBC
HCT: 36.5 % — ABNORMAL LOW (ref 39.0–52.0)
Platelets: 178 10*3/uL (ref 150–400)
RBC: 4.2 MIL/uL — ABNORMAL LOW (ref 4.22–5.81)
RDW: 12.9 % (ref 11.5–15.5)
WBC: 10.1 10*3/uL (ref 4.0–10.5)

## 2012-12-28 MED ORDER — RIVAROXABAN 10 MG PO TABS: 10.0000 mg | ORAL_TABLET | Freq: Every day | ORAL | Status: DC

## 2012-12-28 MED ORDER — TRAMADOL HCL 50 MG PO TABS: 50.0000 mg | ORAL_TABLET | Freq: Four times a day (QID) | ORAL | Status: DC | PRN

## 2012-12-28 MED ORDER — OXYCODONE HCL 5 MG PO TABS: 5.0000 mg | ORAL_TABLET | ORAL | Status: DC | PRN

## 2012-12-28 MED ORDER — VORTIOXETINE HBR 20 MG PO TABS: 20.0000 mg | ORAL_TABLET | Freq: Every day | ORAL | Status: DC

## 2012-12-28 MED ORDER — METHOCARBAMOL 500 MG PO TABS: 500.0000 mg | ORAL_TABLET | Freq: Four times a day (QID) | ORAL | Status: DC | PRN

## 2012-12-28 NOTE — Progress Notes (Signed)
   Subjective: 1 Day Post-Op Procedure(s) (LRB): TOTAL HIP ARTHROPLASTY ANTERIOR APPROACH (Left) Patient reports pain as 2 on 0-10 scale.  Mainly has muscle spasm We will start therapy today.  Plan is to go Home after hospital stay.  Objective: Vital signs in last 24 hours: Temp:  [97 F (36.1 C)-98.6 F (37 C)] 97.8 F (36.6 C) (03/08 0624) Pulse Rate:  [61-85] 85 (03/08 0624) Resp:  [8-20] 20 (03/08 0624) BP: (115-155)/(74-105) 115/76 mmHg (03/08 0624) SpO2:  [97 %-100 %] 100 % (03/08 0624)  Intake/Output from previous day:  Intake/Output Summary (Last 24 hours) at 12/28/12 0946 Last data filed at 12/28/12 0800  Gross per 24 hour  Intake   2575 ml  Output   1950 ml  Net    625 ml    Intake/Output this shift: Total I/O In: -  Out: 150 [Drains:150]  Labs:  Recent Labs  12/28/12 0520  HGB 12.9*    Recent Labs  12/28/12 0520  WBC 10.1  RBC 4.20*  HCT 36.5*  PLT 178    Recent Labs  12/28/12 0520  NA 137  K 4.0  CL 101  CO2 28  BUN 8  CREATININE 0.98  GLUCOSE 134*  CALCIUM 8.7   No results found for this basename: LABPT, INR,  in the last 72 hours  EXAM General - Patient is Alert, Appropriate and Oriented Extremity - Neurologically intact Neurovascular intact No cellulitis present Compartment soft Dressing - dressing C/D/I Motor Function - intact, moving foot and toes well on exam.  Hemovac pulled without difficulty.  Past Medical History  Diagnosis Date  . Hypertension   . Anxiety     Assessment/Plan: 1 Day Post-Op Procedure(s) (LRB): TOTAL HIP ARTHROPLASTY ANTERIOR APPROACH (Left) Principal Problem:   OA (osteoarthritis) of hip   Advance diet Up with therapy D/C IV fluids Plan for discharge tomorrow  DVT Prophylaxis - Xarelto Weight Bearing As Tolerated left Leg D/C Knee Immobilizer Hemovac Pulled   Cameron Nelson V

## 2012-12-28 NOTE — Progress Notes (Signed)
Physical Therapy Treatment Patient Details Name: Cameron Nelson MRN: [id number] DOB: 01-05-1966 Today's Date: 12/28/2012 Time: 7829-5621 PT Time Calculation (min): 19 min  PT Assessment / Plan / Recommendation Comments on Treatment Session  Patient progressing well with PT.    Follow Up Recommendations  Home health PT;Supervision/Assistance - 24 hour     Does the patient have the potential to tolerate intense rehabilitation     Barriers to Discharge        Equipment Recommendations  Rolling walker with 5" wheels (3-in-1 BSC)    Recommendations for Other Services    Frequency 7X/week   Plan Discharge plan remains appropriate;Frequency remains appropriate    Precautions / Restrictions Precautions Precautions: None Restrictions Weight Bearing Restrictions: Yes LLE Weight Bearing: Weight bearing as tolerated   Pertinent Vitals/Pain     Mobility  Bed Mobility Bed Mobility: Supine to Sit;Sitting - Scoot to Edge of Bed Supine to Sit: 4: Min guard;With rails;HOB flat Sitting - Scoot to Edge of Bed: 5: Supervision Details for Bed Mobility Assistance: Verbal reminders for technique. Transfers Transfers: Sit to Stand;Stand to Sit Sit to Stand: 4: Min guard;With upper extremity assist;From bed Stand to Sit: 4: Min guard;With upper extremity assist;With armrests;To chair/3-in-1 Details for Transfer Assistance: Verbal cues for hand placement and placement of LLE during transfers. Ambulation/Gait Ambulation/Gait Assistance: 4: Min guard Ambulation Distance (Feet): 74 Feet Assistive device: Rolling walker Ambulation/Gait Assistance Details: Verbal cues for safe use of RW, and gait sequence.  Cues to take small steps during turns. Gait Pattern: Step-to pattern;Decreased stance time - left;Decreased step length - right;Antalgic;Trunk flexed Gait velocity: Slow gait speed.    Exercises Total Joint Exercises Ankle Circles/Pumps: AROM;Both;10 reps;Seated Quad Sets: AROM;Left;10  reps;Seated Heel Slides: AROM;Left;10 reps;Seated Hip ABduction/ADduction: AROM;Left;10 reps;Seated     PT Goals Acute Rehab PT Goals PT Goal: Supine/Side to Sit - Progress: Progressing toward goal PT Goal: Sit to Stand - Progress: Progressing toward goal PT Goal: Ambulate - Progress: Progressing toward goal PT Goal: Perform Home Exercise Program - Progress: Progressing toward goal  Visit Information  Last PT Received On: 12/28/12 Assistance Needed: +1    Subjective Data  Subjective: "I'm still hurting, but we'll give it a try"   Cognition  Cognition Overall Cognitive Status: Appears within functional limits for tasks assessed/performed Arousal/Alertness: Awake/alert Orientation Level: Appears intact for tasks assessed Behavior During Session: [facility] for tasks performed    Balance     End of Session PT - End of Session Equipment Utilized During Treatment: Gait belt Activity Tolerance: Patient limited by pain Patient left: in chair;with call bell/phone within reach;with family/visitor present Nurse Communication: Mobility status;Patient requests pain meds   GP     Vena Austria 12/28/2012, 1:15 PM Durenda Hurt. Renaldo Fiddler, [facility] Acute Rehab Services Pager [phone]

## 2012-12-28 NOTE — Progress Notes (Signed)
Physical Therapy Treatment Patient Details Name: Cameron Nelson MRN: [id number] DOB: 09-28-1966 Today's Date: 12/28/2012 Time: 4098-1191 PT Time Calculation (min): 32 min  PT Assessment / Plan / Recommendation Comments on Treatment Session  Patient progressing well with PT.  Will complete stair training in am.    Follow Up Recommendations  Home health PT;Supervision/Assistance - 24 hour     Does the patient have the potential to tolerate intense rehabilitation     Barriers to Discharge        Equipment Recommendations  Rolling walker with 5" wheels (3-in-1 BSC)    Recommendations for Other Services    Frequency 7X/week   Plan Discharge plan remains appropriate;Frequency remains appropriate    Precautions / Restrictions Precautions Precautions: None Restrictions Weight Bearing Restrictions: Yes LLE Weight Bearing: Weight bearing as tolerated   Pertinent Vitals/Pain     Mobility  Bed Mobility Bed Mobility: Sit to Supine;Sit to Sidelying Right Rolling Right: 6: Modified independent (Device/Increase time) Rolling Left: 6: Modified independent (Device/Increase time) Sit to Supine: 4: Min assist;HOB flat;With rail Sit to Sidelying Right: 5: Set up;HOB flat;With rail Scooting to [facility]: 6: Modified independent (Device/Increase time) Details for Bed Mobility Assistance: Verbal cues for technique.  Assist to raise RLE onto bed.  Placed pillow between knees to roll to right for position change. (Simultaneous filing. User may not have seen previous data.) Transfers Transfers: Sit to Stand;Stand to Sit Sit to Stand: 4: Min guard;With upper extremity assist;With armrests;From chair/3-in-1 Stand to Sit: 4: Min guard;With upper extremity assist;To bed Details for Transfer Assistance: Patient used proper hand placement.  Assist for safety only. Ambulation/Gait Ambulation/Gait Assistance: 4: Min guard Ambulation Distance (Feet): 350 Feet Assistive device: Rolling walker Ambulation/Gait  Assistance Details: Instructed patient to roll RW and step behind it with a more normal gait pattern. Gait Pattern: Step-through pattern;Decreased stance time - right;Decreased step length - left;Antalgic;Trunk flexed Gait velocity: Slow gait speed.    Exercises Total Joint Exercises Ankle Circles/Pumps: AROM;Both;10 reps;Seated Quad Sets: AROM;Left;10 reps;Seated Short Arc Quad: AROM;Left;10 reps;Seated Heel Slides: AROM;Left;10 reps;Seated     PT Goals Acute Rehab PT Goals PT Goal: Supine/Side to Sit - Progress: Progressing toward goal PT Goal: Sit to Supine/Side - Progress: Progressing toward goal PT Goal: Sit to Stand - Progress: Progressing toward goal PT Goal: Ambulate - Progress: Progressing toward goal PT Goal: Perform Home Exercise Program - Progress: Progressing toward goal  Visit Information  Last PT Received On: 12/28/12 Assistance Needed: +1    Subjective Data  Subjective: "I'm feeling better this afternoon.  Sitting in the chair is helping"   Cognition  Cognition Overall Cognitive Status: Appears within functional limits for tasks assessed/performed Arousal/Alertness: Awake/alert Orientation Level: Appears intact for tasks assessed Behavior During Session: [facility] for tasks performed    Balance     End of Session PT - End of Session Equipment Utilized During Treatment: Gait belt Activity Tolerance: Patient tolerated treatment well Patient left: in bed;with call bell/phone within reach Nurse Communication: Mobility status   GP     Cameron Nelson 12/28/2012, 5:04 PM Durenda Hurt. Renaldo Fiddler, [facility] Acute Rehab Services Pager [phone]

## 2012-12-28 NOTE — Plan of Care (Signed)
Problem: Consults Goal: Diagnosis- Total Joint Replacement Outcome: Progressing Hemiarthroplasty     

## 2012-12-28 NOTE — Evaluation (Signed)
Occupational Therapy Evaluation Patient Details  Name: Cameron Nelson MRN: [id number] DOB: 09/25/1966 Today's Date: 12/28/2012 Time: 4098-1191 OT Time Calculation (min): 12 min  OT Assessment / Plan / Recommendation Clinical Impression  47 yo male s/p Lt THA direct anterior that could benefit from skilled OT acutely. No ot follow up recommend    OT Assessment  Patient needs continued OT Services    Follow Up Recommendations  No OT follow up    Barriers to Discharge      Equipment Recommendations  None recommended by OT    Recommendations for Other Services    Frequency  Min 2X/week    Precautions / Restrictions Precautions Precautions: None Restrictions Weight Bearing Restrictions: Yes LLE Weight Bearing: Weight bearing as tolerated   Pertinent Vitals/Pain None reported    ADL  Eating/Feeding: Independent Where Assessed - Eating/Feeding: Bed level Grooming: Wash/dry face;Independent Where Assessed - Grooming: Supine, head of bed up Upper Body Bathing: Chest;Right arm;Left arm;Abdomen;Modified independent Where Assessed - Upper Body Bathing: Supported sitting Lower Body Bathing: Moderate assistance Where Assessed - Lower Body Bathing: Supported sitting Transfers/Ambulation Related to ADLs: bed mobility only ADL Comments: Pt finishing with PT . Ot observed patient ambulating with PT Darl Pikes. Pt completed bed mobility for dinner tray. pt declined OOB at this time due to just returning. Pt provided visual demonstration and teach back method for shower transfer. Pt plans to get 3n1 from wife's friend and place in walk in shower. pt has tub shower as listed in PT orginial note however after disucssion the walk in shower is best option. Pt will not be able to shower until able to access the second floor of the house. Pt unable to reach lt le. pt needs education on use of reacher for Lt LE. Pt with > 1 year assist with shoes and socks from family. pt not interested in hip kit    OT  Diagnosis: Generalized weakness;Acute pain  OT Problem List: Decreased strength;Decreased activity tolerance;Impaired balance (sitting and/or standing);Decreased safety awareness;Decreased knowledge of use of DME or AE;Decreased knowledge of precautions OT Treatment Interventions: Self-care/ADL training;DME and/or AE instruction;Therapeutic activities;Balance training;Patient/family education   OT Goals Acute Rehab OT Goals OT Goal Formulation: With patient Time For Goal Achievement: 01/11/13 Potential to Achieve Goals: Good ADL Goals Pt Will Perform Lower Body Dressing: with modified independence;Sit to stand from chair;with adaptive equipment ADL Goal: Lower Body Dressing - Progress: Goal set today Pt Will Perform Tub/Shower Transfer: with modified independence;Ambulation ADL Goal: Tub/Shower Transfer - Progress: Goal set today  Visit Information  Last OT Received On: 12/28/12 Assistance Needed: +1    Subjective Data  Subjective: there is lasagna under there Patient Stated Goal: to go home tomrorow   Prior Functioning     Home Living Lives With: Spouse;Family Available Help at Discharge: Family;Available 24 hours/day Type of Home: House Home Access: Stairs to enter [employer] of Steps: 3 Entrance Stairs-Rails: Right;Left Home Layout: Multi-level;Bed/bath upstairs Alternate Level Stairs-Number of Steps: flight Alternate Level Stairs-Rails: Right Bathroom Shower/Tub: Door;Walk-in shower (2nd floor/ main floor only sink) Firefighter: Standard Home Adaptive Equipment: None Additional Comments: pt's wife is borrowing equipment Prior Function Level of Independence: Independent Able to Take Stairs?: Yes Driving: Yes Vocation: Full time employment Communication Communication: No difficulties Dominant Hand: Right         Vision/Perception Vision - History Baseline Vision: No visual deficits Patient Visual Report: No change from baseline    Cognition  Cognition Overall Cognitive Status: Appears within functional limits  for tasks assessed/performed Arousal/Alertness: Awake/alert Orientation Level: Appears intact for tasks assessed Behavior During Session: [facility] for tasks performed    Extremity/Trunk Assessment Right Upper Extremity Assessment RUE ROM/Strength/Tone: WFL for tasks assessed RUE Sensation: WFL - Light Touch Left Upper Extremity Assessment LUE ROM/Strength/Tone: WFL for tasks assessed LUE Sensation: WFL - Light Touch Trunk Assessment Trunk Assessment: Normal     Mobility Bed Mobility Bed Mobility: Supine to Sit;Sitting - Scoot to Edge of Bed;Sit to Supine;Sit to Sidelying Right Rolling Right: 6: Modified independent (Device/Increase time) Rolling Left: 6: Modified independent (Device/Increase time) Scooting to [facility]: 6: Modified independent (Device/Increase time) Details for Bed Mobility Assistance: pt repositioning self without assistance Transfers Transfers: Not assessed     Exercise     Balance     End of Session OT - End of Session Activity Tolerance: Patient tolerated treatment well Patient left: in bed;with call bell/phone within reach Nurse Communication: Mobility status;Precautions  GO     Lucile Shutters 12/28/2012, 5:00 PM Pager: [phone]

## 2012-12-29 LAB — CBC
HCT: 32.2 % — ABNORMAL LOW (ref 39.0–52.0)
Hemoglobin: 11.6 g/dL — ABNORMAL LOW (ref 13.0–17.0)
MCV: 87.3 fL (ref 78.0–100.0)
RBC: 3.69 MIL/uL — ABNORMAL LOW (ref 4.22–5.81)
WBC: 15.1 10*3/uL — ABNORMAL HIGH (ref 4.0–10.5)

## 2012-12-29 LAB — BASIC METABOLIC PANEL
BUN: 8 mg/dL (ref 6–23)
CO2: 25 mEq/L (ref 19–32)
Chloride: 100 mEq/L (ref 96–112)
Creatinine, Ser: 0.83 mg/dL (ref 0.50–1.35)
GFR calc Af Amer: >90 mL/min (ref >90)
Glucose, Bld: 118 mg/dL — ABNORMAL HIGH (ref 70–99)
Potassium: 3.8 mEq/L (ref 3.5–5.1)

## 2012-12-29 NOTE — Progress Notes (Signed)
Subjective: 2 Days Post-Op Procedure(s) (LRB): TOTAL HIP ARTHROPLASTY ANTERIOR APPROACH (Left) Patient reports pain as mild.    Objective: Vital signs in last 24 hours: Temp:  [97.5 F (36.4 C)-97.9 F (36.6 C)] 97.9 F (36.6 C) (03/09 0559) Pulse Rate:  [78-104] 88 (03/09 0559) Resp:  [16] 16 (03/09 0559) BP: (122-137)/(70-90) 122/70 mmHg (03/09 0559) SpO2:  [99 %-100 %] 100 % (03/09 0559)  Intake/Output from previous day: 03/08 0701 - 03/09 0700 In: 500 [P.O.:500] Out: 2050 [Urine:1850; Drains:200] Intake/Output this shift: Total I/O In: -  Out: 400 [Urine:400]   Recent Labs  12/28/12 0520 12/29/12 0620  HGB 12.9* 11.6*    Recent Labs  12/28/12 0520 12/29/12 0620  WBC 10.1 15.1*  RBC 4.20* 3.69*  HCT 36.5* 32.2*  PLT 178 164    Recent Labs  12/28/12 0520 12/29/12 0620  NA 137 138  K 4.0 3.8  CL 101 100  CO2 28 25  BUN 8 8  CREATININE 0.98 0.83  GLUCOSE 134* 118*  CALCIUM 8.7 9.0   No results found for this basename: LABPT, INR,  in the last 72 hours  Neurologically intact Neurovascular intact Sensation intact distally Intact pulses distally Dorsiflexion/Plantar flexion intact Incision: dressing C/D/I and no drainage No cellulitis present Compartment soft No calf pain or sign of DVT  Assessment/Plan: 2 Days Post-Op Procedure(s) (LRB): TOTAL HIP ARTHROPLASTY ANTERIOR APPROACH (Left) Advance diet Up with therapy D/C IV fluids Discharge home with home health Dressing change prior to D/C  Angeles Paolucci M. 12/29/2012, 9:48 AM

## 2012-12-29 NOTE — Progress Notes (Signed)
Physical Therapy Treatment Patient Details Name: Cameron Nelson MRN: [id number] DOB: [birth year] Today's Date: 12/29/2012 Time: 4098-1191 PT Time Calculation (min): 20 min  PT Assessment / Plan / Recommendation Comments on Treatment Session  Pt making good progress toward goals with incr stair instruction completed today and less cues/assistance needed with mobilty.    Follow Up Recommendations  Home health PT;Supervision/Assistance - 24 hour           Equipment Recommendations  Rolling walker with 5" wheels       Frequency 7X/week   Plan Discharge plan remains appropriate;Frequency remains appropriate    Precautions / Restrictions Precautions Precautions: None Restrictions Weight Bearing Restrictions: Yes LLE Weight Bearing: Weight bearing as tolerated       Mobility  Bed Mobility Supine to Sit: 6: Modified independent (Device/Increase time);HOB flat Sitting - Scoot to Edge of Bed: 6: Modified independent (Device/Increase time) Details for Bed Mobility Assistance: bed flat and no rails used. no assist or cues needed, only incr time required Transfers Sit to Stand: 6: Modified independent (Device/Increase time);From bed;With upper extremity assist Stand to Sit: 6: Modified independent (Device/Increase time);To chair/3-in-1;With upper extremity assist;With armrests Details for Transfer Assistance: no cues or assistance needed. Ambulation/Gait Ambulation/Gait Assistance: 6: Modified independent (Device/Increase time) Ambulation Distance (Feet): 200 Feet Assistive device: Rolling walker Gait Pattern: Within Functional Limits;Step-through pattern;Antalgic Gait velocity: Decreased Stairs: Yes Stairs Assistance: 5: Supervision Stairs Assistance Details (indicate cue type and reason): min cues for technique/sequence with stairs. Stair Management Technique: One rail Left;Sideways;Step to pattern Number of Stairs: 6    Exercises Total Joint Exercises Ankle Circles/Pumps:  AROM;Both;10 reps;Supine Quad Sets: AROM;Strengthening;Both;10 reps;Supine Heel Slides: AROM;Strengthening;Left;10 reps;Supine Hip ABduction/ADduction: AROM;Strengthening;Left;10 reps;Supine     PT Goals Acute Rehab PT Goals PT Goal: Supine/Side to Sit - Progress: Progressing toward goal PT Goal: Sit to Stand - Progress: Met PT Goal: Ambulate - Progress: Met PT Goal: Up/Down Stairs - Progress: Met PT Goal: Perform Home Exercise Program - Progress: Progressing toward goal  Visit Information  Last PT Received On: 12/29/12 Assistance Needed: +1    Subjective Data  Subjective: No new complaints, agreeable to therapy today.   Cognition  Cognition Overall Cognitive Status: Appears within functional limits for tasks assessed/performed Arousal/Alertness: Awake/alert Orientation Level: Appears intact for tasks assessed Behavior During Session: [facility] for tasks performed       End of Session PT - End of Session Equipment Utilized During Treatment: Gait belt Activity Tolerance: Patient tolerated treatment well Patient left: in chair;with call bell/phone within reach Nurse Communication: Mobility status   GP     Sallyanne Kuster 12/29/2012, 11:54 AM  Sallyanne Kuster, PTA Office- [phone]

## 2012-12-29 NOTE — Progress Notes (Signed)
12/29/2012 1730 [facility] aware of pt dc home. Pt was preoperatively set for [facility]. Isidoro Donning RN CCM Case Mgmt phone [phone]

## 2012-12-30 ENCOUNTER — Encounter (HOSPITAL_COMMUNITY): Payer: Self-pay | Admitting: Orthopedic Surgery

## 2012-12-30 NOTE — Discharge Summary (Signed)
Physician Discharge Summary   Patient ID: Cameron Nelson MRN: [id number] DOB/AGE: 01-08-1966 47 y.o.  Admit date: 12/27/2012 Discharge date: 12/29/2012  Primary Diagnosis: left hip DJD  Admission Diagnoses:  Past Medical History  Diagnosis Date  . Hypertension   . Anxiety    Discharge Diagnoses:   Principal Problem:   OA (osteoarthritis) of hip  There is no weight on file to calculate BMI.  Procedure(s) (LRB): TOTAL HIP ARTHROPLASTY ANTERIOR APPROACH (Left)   Consults: None  HPI: see H&P Laboratory Data: Admission on 12/27/2012, Discharged on 12/29/2012  Component Date Value Range Status  . ABO/RH(D) 12/27/2012 O POS   Final  . Antibody Screen 12/27/2012 NEG   Final  . Sample Expiration 12/27/2012 12/30/2012   Final  . ABO/RH(D) 12/27/2012 O POS   Final  . WBC 12/28/2012 10.1  4.0 - 10.5 K/uL Final  . RBC 12/28/2012 4.20* 4.22 - 5.81 MIL/uL Final  . Hemoglobin 12/28/2012 12.9* 13.0 - 17.0 g/dL Final  . HCT 78/29/5621 36.5* 39.0 - 52.0 % Final  . MCV 12/28/2012 86.9  78.0 - 100.0 fL Final  . MCH 12/28/2012 30.7  26.0 - 34.0 pg Final  . MCHC 12/28/2012 35.3  30.0 - 36.0 g/dL Final  . RDW 30/86/5784 12.9  11.5 - 15.5 % Final  . Platelets 12/28/2012 178  150 - 400 K/uL Final  . Sodium 12/28/2012 137  135 - 145 mEq/L Final  . Potassium 12/28/2012 4.0  3.5 - 5.1 mEq/L Final  . Chloride 12/28/2012 101  96 - 112 mEq/L Final  . CO2 12/28/2012 28  19 - 32 mEq/L Final  . Glucose, Bld 12/28/2012 134* 70 - 99 mg/dL Final  . BUN 69/62/9528 8  6 - 23 mg/dL Final  . Creatinine, Ser 12/28/2012 0.98  0.50 - 1.35 mg/dL Final  . Calcium 41/32/4401 8.7  8.4 - 10.5 mg/dL Final  . GFR calc non Af Amer 12/28/2012 >90  >90 mL/min Final  . GFR calc Af Amer 12/28/2012 >90  >90 mL/min Final   Comment:                                 The eGFR has been calculated                          using the CKD EPI equation.                          This calculation has not been   validated in all clinical                          situations.                          eGFR's persistently                          <90 mL/min signify                          possible Chronic Kidney Disease.  . WBC 12/29/2012 15.1* 4.0 - 10.5 K/uL Final  . RBC 12/29/2012 3.69* 4.22 - 5.81 MIL/uL Final  . Hemoglobin 12/29/2012 11.6* 13.0 - 17.0 g/dL Final  . HCT 02/72/5366 32.2* 39.0 -  52.0 % Final  . MCV 12/29/2012 87.3  78.0 - 100.0 fL Final  . MCH 12/29/2012 31.4  26.0 - 34.0 pg Final  . MCHC 12/29/2012 36.0  30.0 - 36.0 g/dL Final  . RDW 16/07/9603 12.7  11.5 - 15.5 % Final  . Platelets 12/29/2012 164  150 - 400 K/uL Final  . Sodium 12/29/2012 138  135 - 145 mEq/L Final  . Potassium 12/29/2012 3.8  3.5 - 5.1 mEq/L Final  . Chloride 12/29/2012 100  96 - 112 mEq/L Final  . CO2 12/29/2012 25  19 - 32 mEq/L Final  . Glucose, Bld 12/29/2012 118* 70 - 99 mg/dL Final  . BUN 54/06/8118 8  6 - 23 mg/dL Final  . Creatinine, Ser 12/29/2012 0.83  0.50 - 1.35 mg/dL Final  . Calcium 14/78/2956 9.0  8.4 - 10.5 mg/dL Final  . GFR calc non Af Amer 12/29/2012 >90  >90 mL/min Final  . GFR calc Af Amer 12/29/2012 >90  >90 mL/min Final   Comment:                                 The eGFR has been calculated                          using the CKD EPI equation.                          This calculation has not been                          validated in all clinical                          situations.                          eGFR's persistently                          <90 mL/min signify                          possible Chronic Kidney Disease.  Hospital Outpatient Visit on 12/19/2012  Component Date Value Range Status  . MRSA, PCR 12/19/2012 NEGATIVE  NEGATIVE Final  . Staphylococcus aureus 12/19/2012 NEGATIVE  NEGATIVE Final   Comment:                                 The Xpert SA Assay (FDA                          approved for NASAL specimens                          in patients over 77  years of age),                          is one component of                          a comprehensive surveillance  program.  Test performance has                          been validated by [facility] for patients greater                          than or equal to 67 year old.                          It is not intended                          to diagnose infection nor to                          guide or monitor treatment.  Marland Kitchen aPTT 12/19/2012 32  24 - 37 seconds Final  . WBC 12/19/2012 7.7  4.0 - 10.5 K/uL Final  . RBC 12/19/2012 4.85  4.22 - 5.81 MIL/uL Final  . Hemoglobin 12/19/2012 15.2  13.0 - 17.0 g/dL Final  . HCT 21/30/8657 42.1  39.0 - 52.0 % Final  . MCV 12/19/2012 86.8  78.0 - 100.0 fL Final  . MCH 12/19/2012 31.3  26.0 - 34.0 pg Final  . MCHC 12/19/2012 36.1* 30.0 - 36.0 g/dL Final  . RDW 84/69/6295 12.6  11.5 - 15.5 % Final  . Platelets 12/19/2012 224  150 - 400 K/uL Final  . Sodium 12/19/2012 139  135 - 145 mEq/L Final  . Potassium 12/19/2012 4.4  3.5 - 5.1 mEq/L Final  . Chloride 12/19/2012 100  96 - 112 mEq/L Final  . CO2 12/19/2012 32  19 - 32 mEq/L Final  . Glucose, Bld 12/19/2012 86  70 - 99 mg/dL Final  . BUN 28/41/3244 10  6 - 23 mg/dL Final  . Creatinine, Ser 12/19/2012 0.95  0.50 - 1.35 mg/dL Final  . Calcium 10/25/7251 9.7  8.4 - 10.5 mg/dL Final  . Total Protein 12/19/2012 7.9  6.0 - 8.3 g/dL Final  . Albumin 66/44/0347 4.4  3.5 - 5.2 g/dL Final  . AST 42/59/5638 32  0 - 37 U/L Final  . ALT 12/19/2012 47  0 - 53 U/L Final  . Alkaline Phosphatase 12/19/2012 70  39 - 117 U/L Final  . Total Bilirubin 12/19/2012 0.4  0.3 - 1.2 mg/dL Final  . GFR calc non Af Amer 12/19/2012 >90  >90 mL/min Final  . GFR calc Af Amer 12/19/2012 >90  >90 mL/min Final   Comment:                                 The eGFR has been calculated                          using the CKD EPI equation.                          This calculation  has not been  validated in all clinical                          situations.                          eGFR's persistently                          <90 mL/min signify                          possible Chronic Kidney Disease.  Marland Kitchen Prothrombin Time 12/19/2012 13.4  11.6 - 15.2 seconds Final  . INR 12/19/2012 1.03  0.00 - 1.49 Final     X-Rays:Dg Chest 2 View  12/19/2012  *RADIOLOGY REPORT*  Clinical Data: Preoperative evaluation for left total hip arthroplasty.  History of hypertension.  1.5 pack year history of smoking  CHEST - 2 VIEW  Comparison: None.  Findings: Heart and mediastinal contours are within normal limits. The lung fields are clear with no signs of focal infiltrate or congestive failure.  No pleural fluid or significant peribronchial cuffing is seen.  Mild bilateral apical pleural thickening is noted.  Bony structures appear intact.  IMPRESSION: No worrisome focal or acute cardiopulmonary abnormality identified   Original Report Authenticated By: Rhodia Albright, M.D.    Dg Hip Complete Left  12/19/2012  *RADIOLOGY REPORT*  Clinical Data: Preop for left hip replacement  LEFT HIP - COMPLETE 2+ VIEW  Comparison: None  Findings: Three views of the left hip submitted. No acute fracture or subluxation.  Degenerative changes are noted with narrowing of superior joint space.  There is acetabular spurring.  Spurring of the femoral head. Mild superior acetabular sclerosis.  IMPRESSION: No acute fracture or subluxation.  Degenerative changes as described above.   Original Report Authenticated By: Natasha Mead, M.D.    Dg Hip Operative Left  12/27/2012  *RADIOLOGY REPORT*  Clinical Data: Osteoarthritis of the left hip  OPERATIVE LEFT HIP  Comparison: 12/19/2012  Findings: Left total hip arthroplasty is in place.  Anatomic alignment of the osseous and prosthetic structures.  No breakage or loosening of hardware.  IMPRESSION: Left total hip arthroplasty anatomically aligned.    Original Report Authenticated By: Jolaine Click, M.D.    Dg Pelvis Portable  12/27/2012  *RADIOLOGY REPORT*  Clinical Data: Postop left hip replacement  PORTABLE PELVIS  Comparison: Portable exam 1545 hours without priors for comparison  Findings: Acetabular and femoral components of a left hip prosthesis are identified in expected positions. Surgical drain present. No acute fracture, dislocation, bone destruction or periprosthetic lucency.  IMPRESSION: Left hip prosthesis without acute complication.   Original Report Authenticated By: Ulyses Southward, M.D.     EKG:No orders found for this or any previous visit.   Hospital Course: Patient was admitted to [facility] and taken to the OR and underwent the above state procedure without complications.  Patient tolerated the procedure well and was later transferred to the recovery room and then to the orthopaedic floor for postoperative care.  They were given PO and IV analgesics for pain control following their surgery.  They were given 24 hours of postoperative antibiotics of  Anti-infectives   Start     Dose/Rate Route Frequency Ordered Stop   12/27/12 1830  ceFAZolin (ANCEF) IVPB 2 g/50 mL premix     2 g 100 mL/hr  over 30 Minutes Intravenous Every 6 hours 12/27/12 1653 12/28/12 0043   12/27/12 0600  ceFAZolin (ANCEF) IVPB 2 g/50 mL premix     2 g 100 mL/hr over 30 Minutes Intravenous 60 min pre-op 12/26/12 1425 12/27/12 1240     and started on DVT prophylaxis in the form of Xarelto.   PT and OT were ordered for total hip protocol.  The patient was allowed to be WBAT with therapy. Discharge planning was consulted to help with postop disposition and equipment needs.  Patient had a good night on the evening of surgery.  They started to get up OOB with therapy on day one.  Hemovac drain was pulled without difficulty.  The knee immobilizer was removed and discontinued.  Continued to work with therapy into day two.  Dressing was changed on day two and  the incision was healing well, clean and dry.  By day two, the patient had progressed with therapy and meeting their goals.  Incision was healing well.  Patient was seen in rounds and was ready to go home.   Discharge Medications: Prior to Admission medications   Medication Sig Start Date End Date Taking? Authorizing Provider  benazepril (LOTENSIN) 20 MG tablet Take 20 mg by mouth daily.   Yes Historical Provider, MD  LORazepam (ATIVAN) 1 MG tablet Take 1 mg by mouth every 8 (eight) hours.   Yes Historical Provider, MD  methocarbamol (ROBAXIN) 500 MG tablet Take 1 tablet (500 mg total) by mouth every 6 (six) hours as needed. 12/28/12   Loanne Drilling, MD  oxyCODONE (OXY IR/ROXICODONE) 5 MG immediate release tablet Take 1-4 tablets (5-20 mg total) by mouth every 3 (three) hours as needed. 12/28/12   Loanne Drilling, MD  rivaroxaban (XARELTO) 10 MG TABS tablet Take 1 tablet (10 mg total) by mouth daily with breakfast. 12/28/12   Loanne Drilling, MD  rosuvastatin (CRESTOR) 10 MG tablet Take 10 mg by mouth daily.   Yes Historical Provider, MD  traMADol (ULTRAM) 50 MG tablet Take 1-2 tablets (50-100 mg total) by mouth every 6 (six) hours as needed. 12/28/12   Loanne Drilling, MD  Vortioxetine HBr (BRINTELLIX) 20 MG TABS Take 20 mg by mouth daily.   Yes Historical Provider, MD    Diet: Regular diet Activity:WBAT No bending hip over 90 degrees- A "L" Angle Do not cross legs Do not let foot roll inward When turning these patients a pillow should be placed between the patient's legs to prevent crossing. Patients should have the affected knee fully extended when trying to sit or stand from all surfaces to prevent excessive hip flexion. When ambulating and turning toward the affected side the affected leg should have the toes turned out prior to moving the walker and the rest of patient's body as to prevent internal rotation/ turning in of the leg. Abduction pillows are the most effective way to prevent a  patient from not crossing legs or turning toes in at rest. If an abduction pillow is not ordered placing a regular pillow length wise between the patient's legs is also an effective reminder. It is imperative that these precautions be maintained so that the surgical hip does not dislocate. Follow-up:in 2 weeks Disposition - Home Discharged Condition: good   Discharge Orders   Future Orders Complete By Expires     Call MD / Call 911  As directed     Comments:      If you experience chest pain or shortness of  breath, CALL 911 and be transported to the hospital emergency room.  If you develope a fever above 101 F, pus (white drainage) or increased drainage or redness at the wound, or calf pain, call your surgeon's office.    Constipation Prevention  As directed     Comments:      Drink plenty of fluids.  Prune juice may be helpful.  You may use a stool softener, such as Colace (over the counter) 100 mg twice a day.  Use MiraLax (over the counter) for constipation as needed.    Diet - low sodium heart healthy  As directed     Increase activity slowly as tolerated  As directed         Medication List    STOP taking these medications       HYDROCODONE-ACETAMINOPHEN PO      TAKE these medications       benazepril 20 MG tablet  Commonly known as:  LOTENSIN  Take 20 mg by mouth daily.     BRINTELLIX 20 MG Tabs  Generic drug:  Vortioxetine HBr  Take 20 mg by mouth daily.     LORazepam 1 MG tablet  Commonly known as:  ATIVAN  Take 1 mg by mouth every 8 (eight) hours.     methocarbamol 500 MG tablet  Commonly known as:  ROBAXIN  Take 1 tablet (500 mg total) by mouth every 6 (six) hours as needed.     oxyCODONE 5 MG immediate release tablet  Commonly known as:  Oxy IR/ROXICODONE  Take 1-4 tablets (5-20 mg total) by mouth every 3 (three) hours as needed.     rivaroxaban 10 MG Tabs tablet  Commonly known as:  XARELTO  Take 1 tablet (10 mg total) by mouth daily with breakfast.       rosuvastatin 10 MG tablet  Commonly known as:  CRESTOR  Take 10 mg by mouth daily.     traMADol 50 MG tablet  Commonly known as:  ULTRAM  Take 1-2 tablets (50-100 mg total) by mouth every 6 (six) hours as needed.           Follow-up Information   Follow up with Loanne Drilling, MD. Schedule an appointment as soon as possible for a visit on 01/14/2013. (Call [phone] tomorrow to make the appointment)    Contact information:   [address] [place] [address] [phone]       Signed: Dorothy Spark. 12/30/2012, 7:36 PM

## 2014-05-13 IMAGING — CR DG CHEST 2V
2 series · 2 of 2 positions shown · non-contrast
Comparison: None.

CLINICAL DATA: Preoperative evaluation for left total hip
arthroplasty.  History of hypertension.  1.5 pack year history of
smoking

CHEST - 2 VIEW

[view not recorded (1 of 2)]
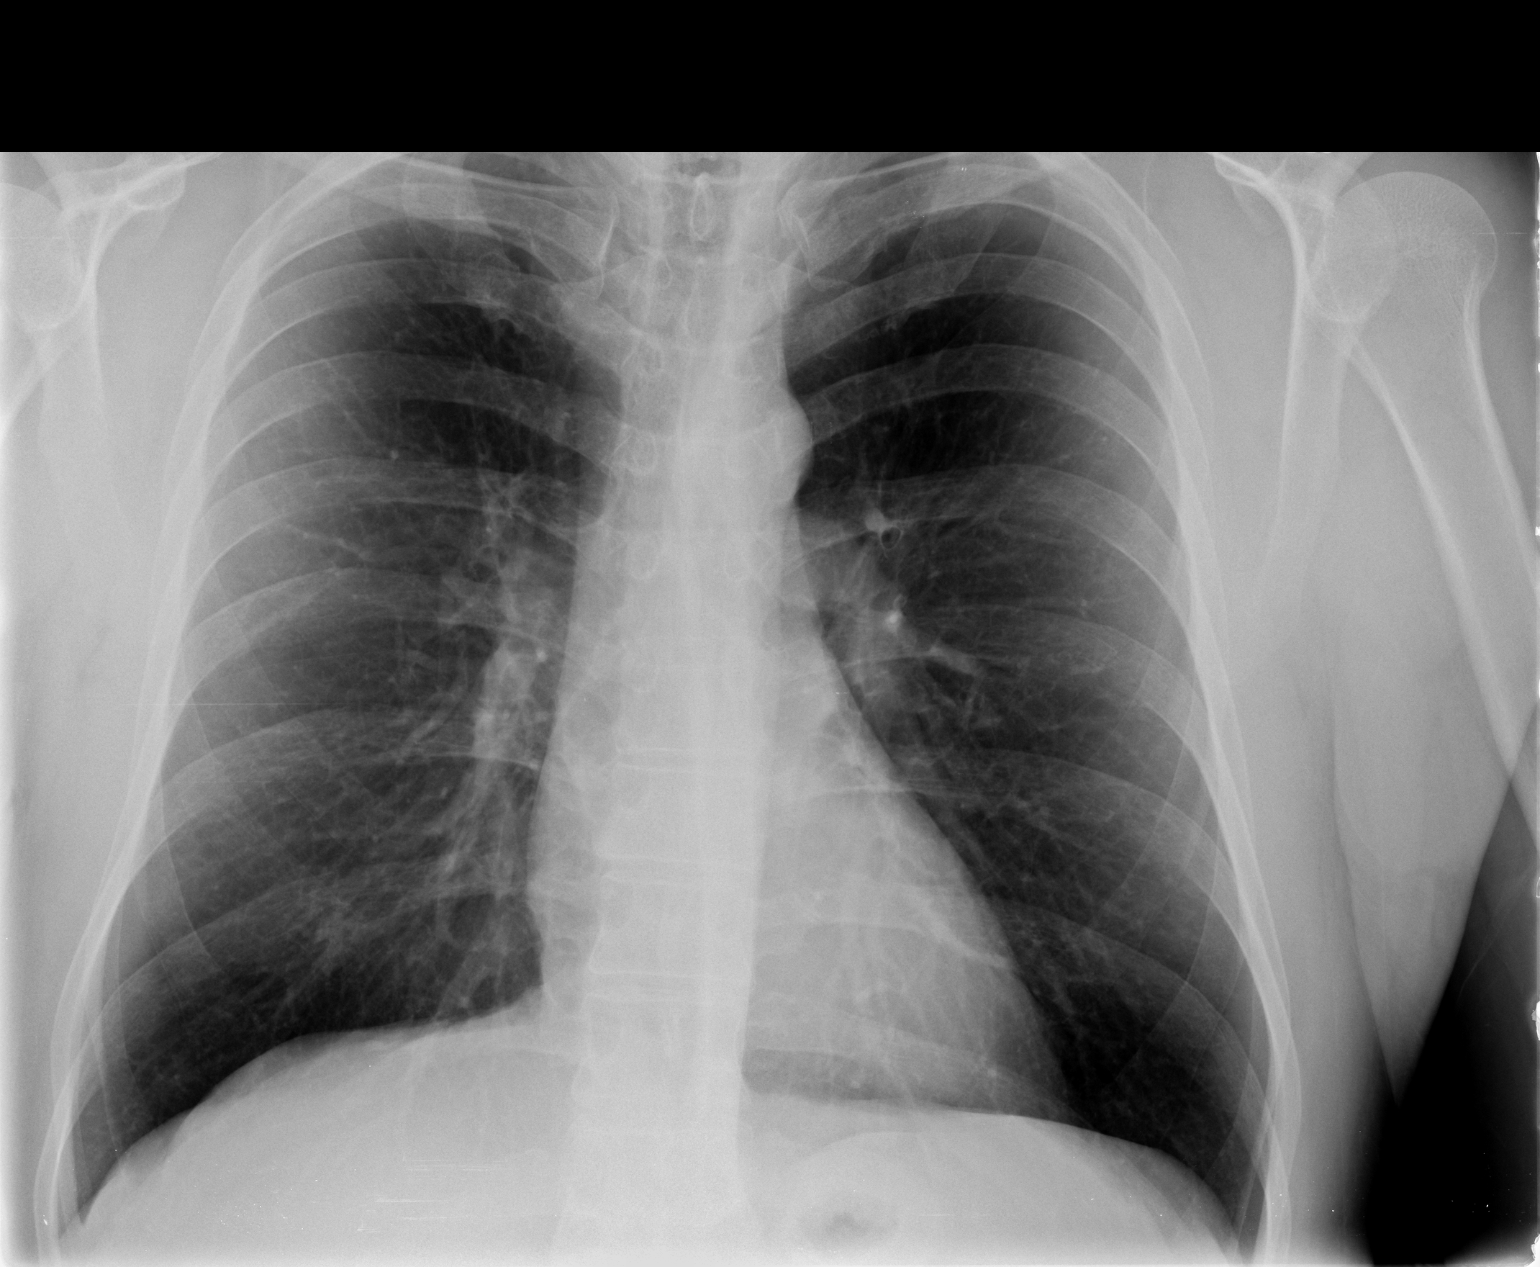

[view not recorded (2 of 2)]
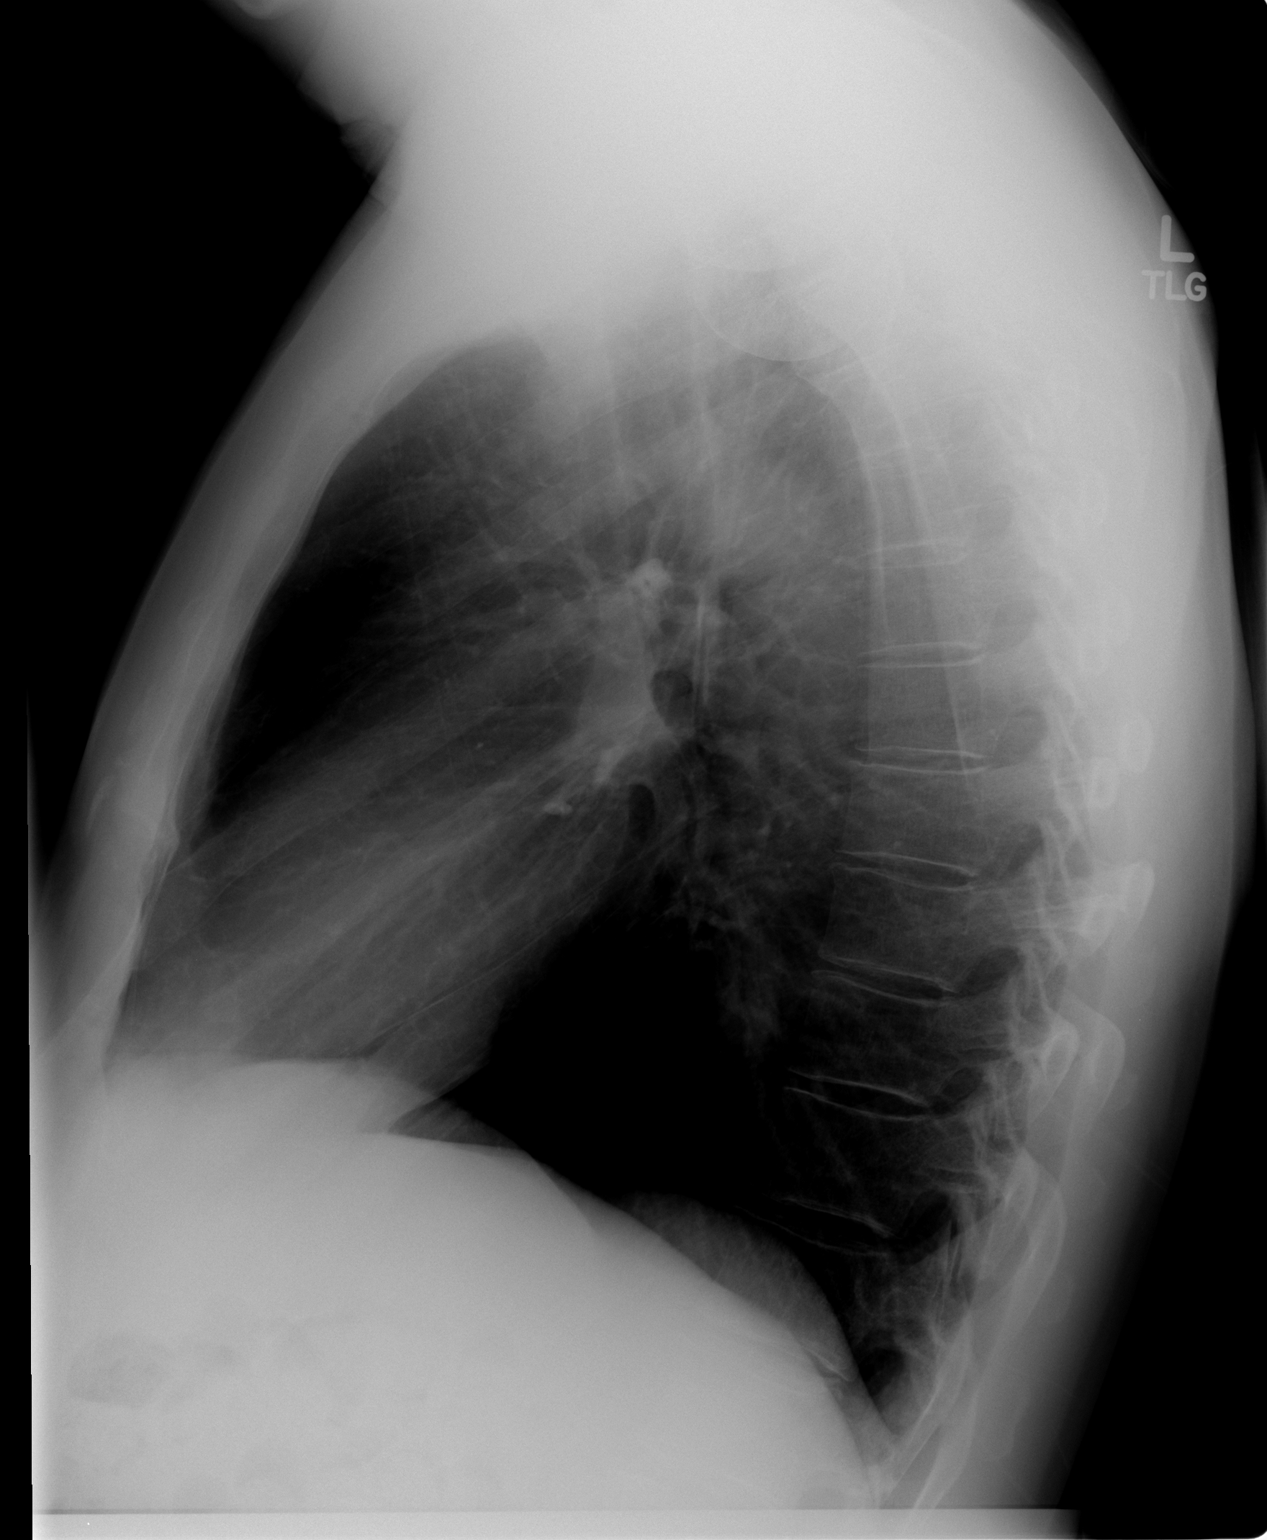

[2 of 2 positions shown; findings below may reference images not displayed]

FINDINGS: Heart and mediastinal contours are within normal limits.
The lung fields are clear with no signs of focal infiltrate or
congestive failure.  No pleural fluid or significant peribronchial
cuffing is seen.  Mild bilateral apical pleural thickening is
noted.

Bony structures appear intact.
IMPRESSION: No worrisome focal or acute cardiopulmonary abnormality identified

## 2014-05-13 IMAGING — CR DG HIP (WITH OR WITHOUT PELVIS) 2-3V*L*
3 series · 3 of 3 positions shown · non-contrast
Comparison: None

CLINICAL DATA: Preop for left hip replacement

LEFT HIP - COMPLETE 2+ VIEW

[view not recorded (1 of 3)]
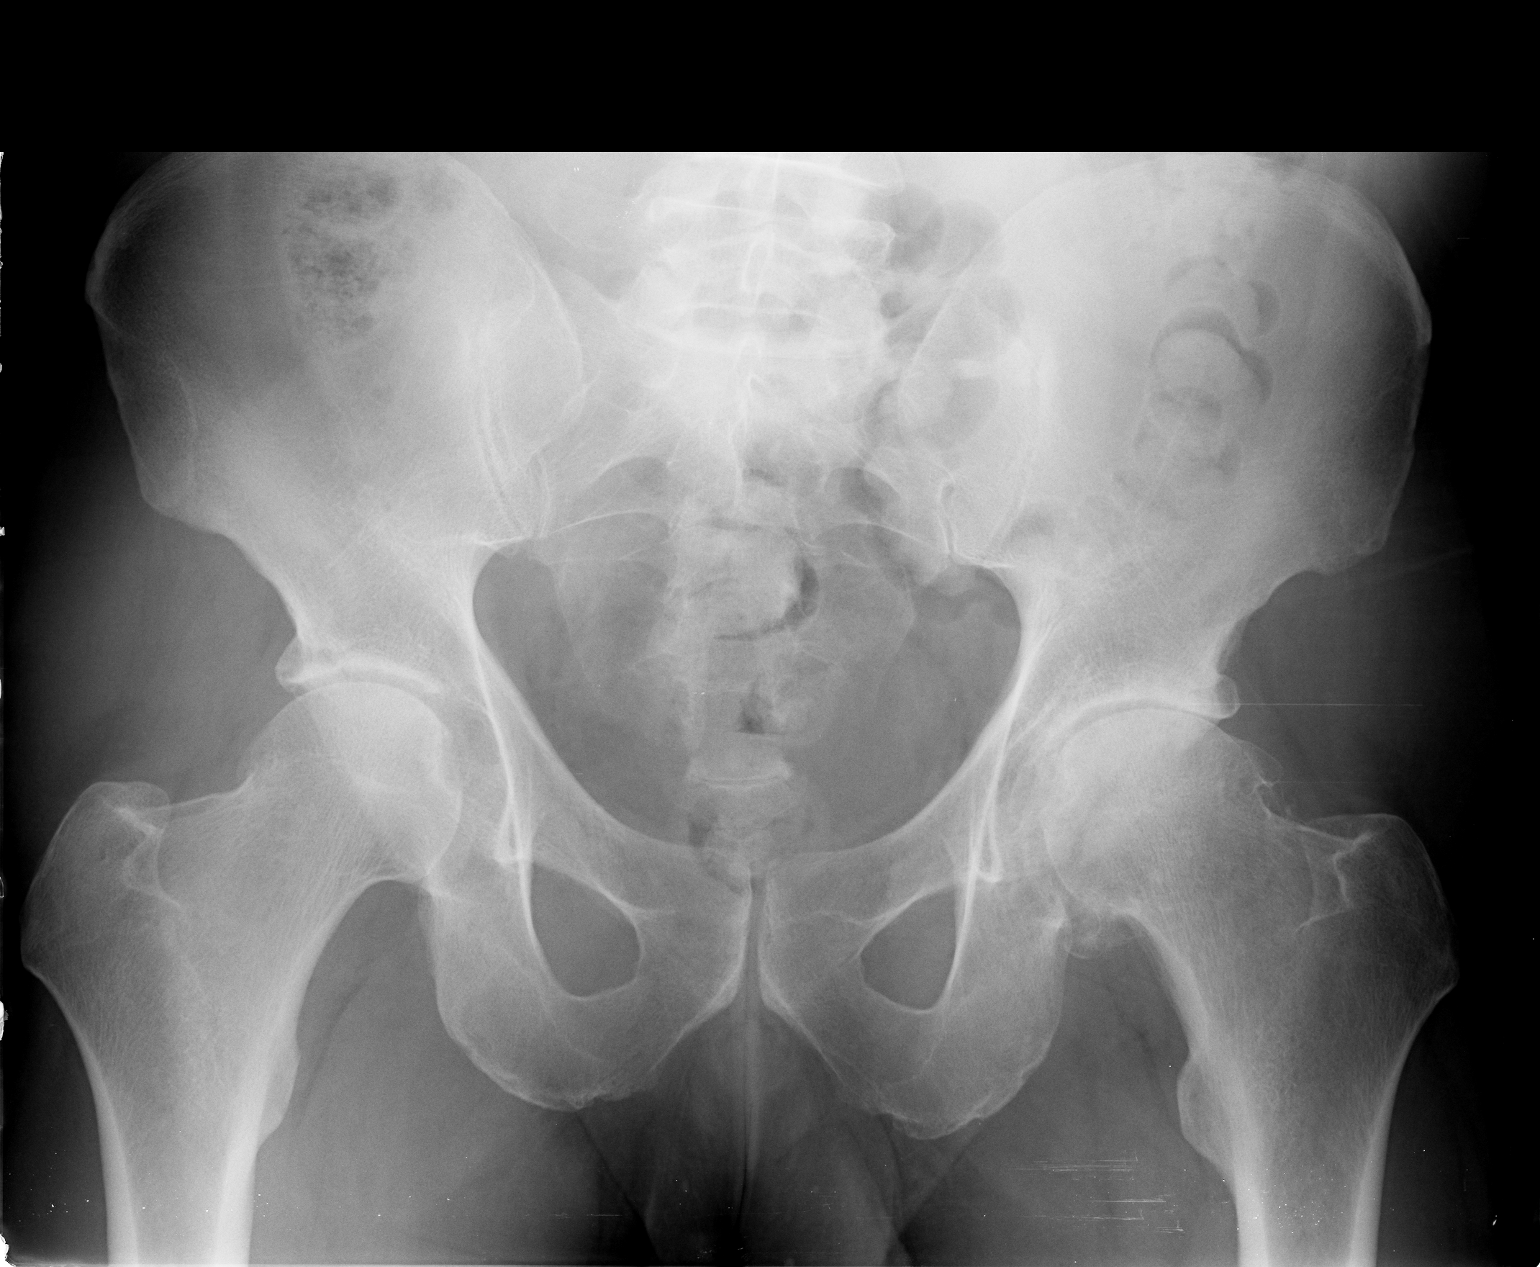

[view not recorded (2 of 3)]
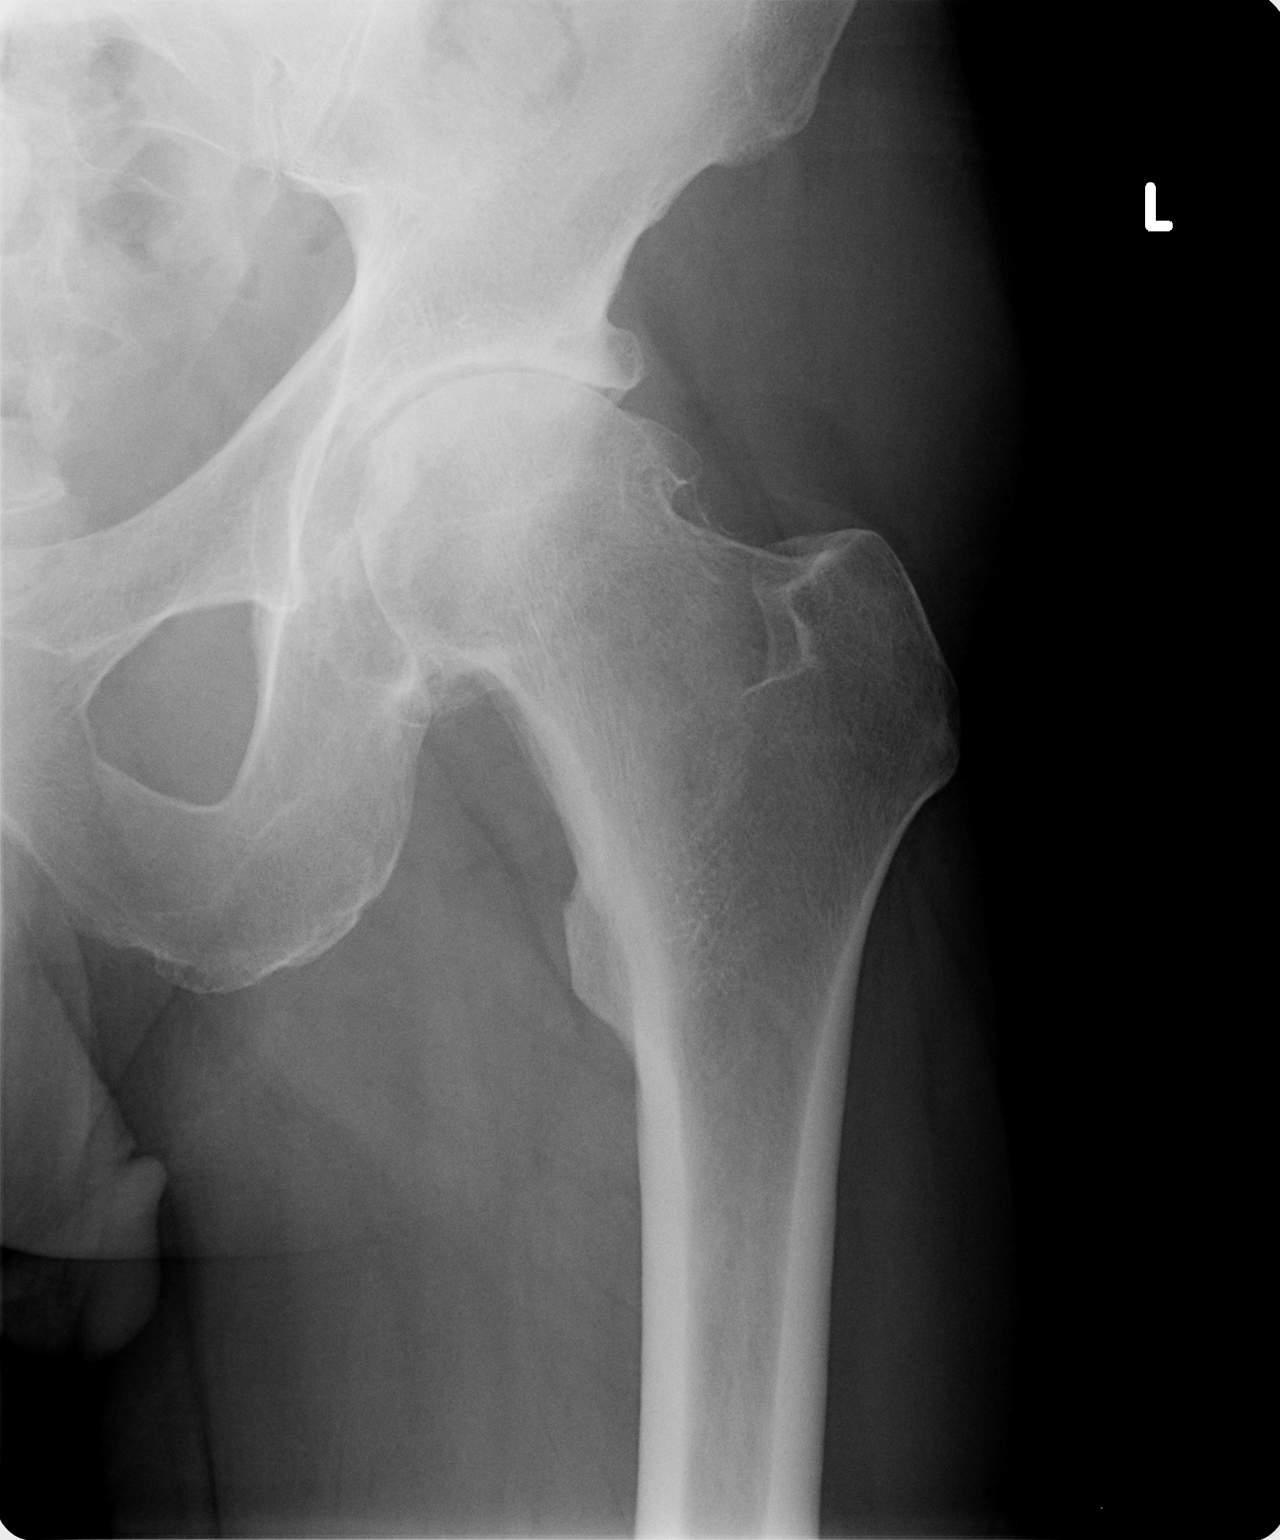

[view not recorded (3 of 3)]
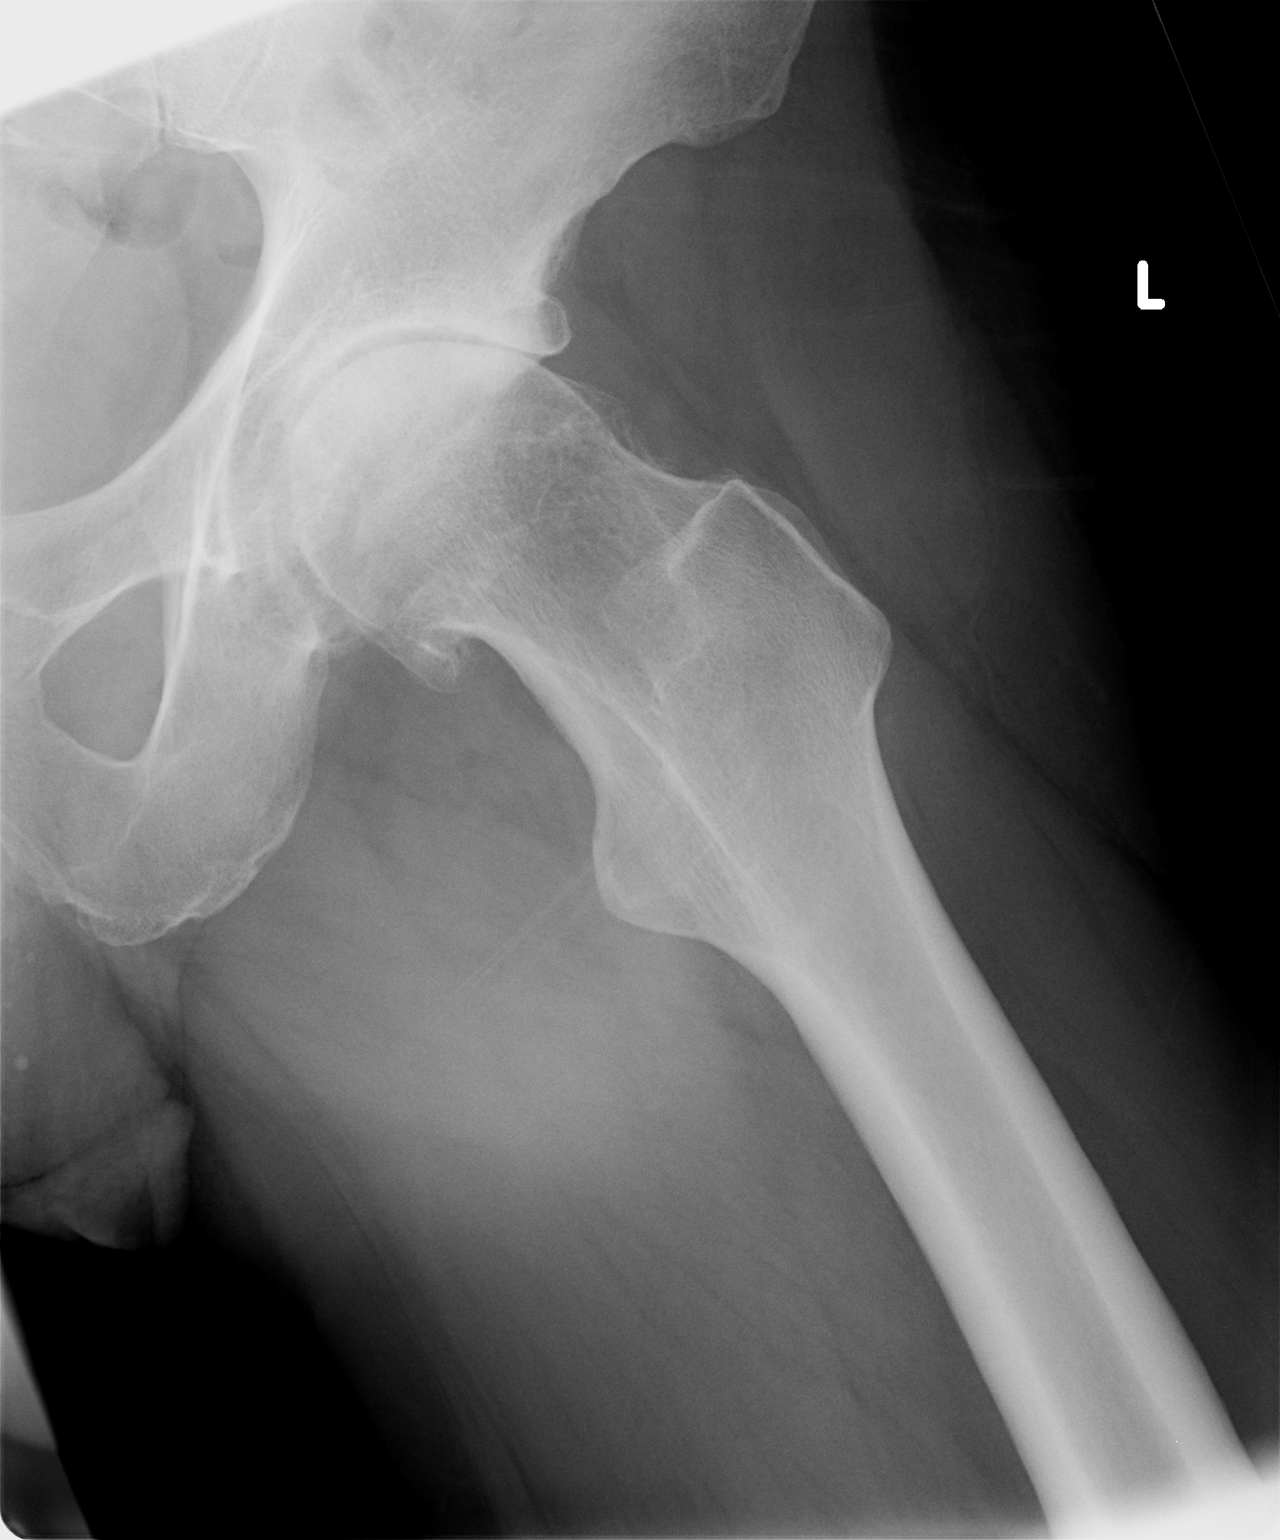

[3 of 3 positions shown; findings below may reference images not displayed]

FINDINGS: Three views of the left hip submitted. No acute fracture
or subluxation.  Degenerative changes are noted with narrowing of
superior joint space.  There is acetabular spurring.  Spurring of
the femoral head. Mild superior acetabular sclerosis.
IMPRESSION: No acute fracture or subluxation.  Degenerative changes as
described above.

## 2015-04-20 ENCOUNTER — Ambulatory Visit (INDEPENDENT_AMBULATORY_CARE_PROVIDER_SITE_OTHER): Payer: 59 | Admitting: Family Medicine

## 2015-04-20 ENCOUNTER — Encounter: Payer: Self-pay | Admitting: Family Medicine

## 2015-04-20 VITALS — BP 110/73 | HR 65 | Temp 97.9°F | Ht 72.0 in | Wt 222.0 lb

## 2015-04-20 DIAGNOSIS — F329 Major depressive disorder, single episode, unspecified: Secondary | ICD-10-CM | POA: Diagnosis not present

## 2015-04-20 DIAGNOSIS — E785 Hyperlipidemia, unspecified: Secondary | ICD-10-CM

## 2015-04-20 DIAGNOSIS — I10 Essential (primary) hypertension: Secondary | ICD-10-CM | POA: Insufficient documentation

## 2015-04-20 DIAGNOSIS — F32A Depression, unspecified: Secondary | ICD-10-CM

## 2015-04-20 MED ORDER — VENLAFAXINE HCL 75 MG PO TABS: 75.0000 mg | ORAL_TABLET | Freq: Two times a day (BID) | ORAL | Status: DC

## 2015-04-20 NOTE — Assessment & Plan Note (Signed)
Discussed length of tx and will restart meds Diet and exercise

## 2015-04-20 NOTE — Progress Notes (Signed)
   BP 110/73 mmHg  Pulse 65  Temp(Src) 97.9 F (36.6 C)  Ht 6' (1.829 m)  Wt 222 lb (100.699 kg)  BMI 30.10 kg/m2  SpO2 99%   Subjective:    Patient ID: Cameron Nelson, male    DOB: 05/11/1966, 49 y.o.   MRN: [id number]  HPI: Cameron Nelson is a 49 y.o. male  Chief Complaint  Patient presents with  . Hyperlipidemia  . Depression   Depression back was doing well but stopped meds after about 6 weeks and having a relapse. Lipids doing well Concerned is on generic now  Relevant past medical, surgical, family and social history reviewed and updated as indicated. Interim medical history since our last visit reviewed. Allergies and medications reviewed and updated.  Review of Systems  Constitutional: Negative.   Respiratory: Negative.   Cardiovascular: Negative.     Per HPI unless specifically indicated above     Objective:    BP 110/73 mmHg  Pulse 65  Temp(Src) 97.9 F (36.6 C)  Ht 6' (1.829 m)  Wt 222 lb (100.699 kg)  BMI 30.10 kg/m2  SpO2 99%  Wt Readings from Last 3 Encounters:  04/20/15 222 lb (100.699 kg)  02/15/15 219 lb (99.338 kg)  12/19/12 205 lb 14.4 oz (93.396 kg)    Physical Exam  Constitutional: He is oriented to person, place, and time. He appears well-developed and well-nourished. No distress.  HENT:  Head: Normocephalic and atraumatic.  Right Ear: Hearing normal.  Left Ear: Hearing normal.  Nose: Nose normal.  Eyes: Conjunctivae and lids are normal. Right eye exhibits no discharge. Left eye exhibits no discharge. No scleral icterus.  Cardiovascular: Normal rate, regular rhythm and normal heart sounds.   Pulmonary/Chest: Effort normal and breath sounds normal. No respiratory distress.  Musculoskeletal: Normal range of motion.  Neurological: He is alert and oriented to person, place, and time.  Skin: Skin is intact. No rash noted.  Psychiatric: He has a normal mood and affect. His speech is normal and behavior is normal. Judgment and thought  content normal. Cognition and memory are normal.        Assessment & Plan:   Problem List Items Addressed This Visit      Other   Hyperlipidemia - Primary    The current medical regimen is effective;  continue present plan and medications.        Depression    Discussed length of tx and will restart meds Diet and exercise       Relevant Medications   venlafaxine (EFFEXOR) 75 MG tablet       Follow up plan: Return in about 2 months (around 06/20/2015) for recheck depression.

## 2015-04-20 NOTE — Assessment & Plan Note (Addendum)
The current medical regimen is effective;  continue present plan and medications.  

## 2015-04-21 ENCOUNTER — Telehealth: Payer: Self-pay | Admitting: Family Medicine

## 2015-04-21 NOTE — Telephone Encounter (Signed)
E-Fax came through for refill: Rx: venlafaxine (EFFEXOR) 75 MG tablet  Copy of Rx in basket

## 2015-06-16 ENCOUNTER — Telehealth: Payer: Self-pay | Admitting: Family Medicine

## 2015-06-16 ENCOUNTER — Other Ambulatory Visit: Payer: Self-pay | Admitting: Family Medicine

## 2015-06-16 MED ORDER — BENAZEPRIL HCL 20 MG PO TABS: 20.0000 mg | ORAL_TABLET | Freq: Every day | ORAL | Status: DC

## 2015-06-16 NOTE — Telephone Encounter (Signed)
Pt called and would like to have benazepril sent in qty of 60 instead of 30 because 30 isnt the full rx. He would [facility] to [facility] and he stated that [facility] would be in touch with [place].

## 2015-06-22 ENCOUNTER — Ambulatory Visit: Payer: BC Managed Care – PPO | Admitting: Family Medicine

## 2015-11-04 ENCOUNTER — Other Ambulatory Visit: Payer: Self-pay | Admitting: Family Medicine

## 2015-11-04 ENCOUNTER — Telehealth: Payer: Self-pay | Admitting: Family Medicine

## 2015-11-04 MED ORDER — ROSUVASTATIN CALCIUM 10 MG PO TABS: 10.0000 mg | ORAL_TABLET | Freq: Every day | ORAL | Status: DC

## 2015-11-04 NOTE — Telephone Encounter (Signed)
Rx sent to his pharmacy

## 2015-11-04 NOTE — Telephone Encounter (Signed)
Pt called stated the pharmacy has been trying to reach us. Patient stated he needs a refill on Crestor. Pharm is [facility] on [address] in [place]. Thanks.

## 2015-11-04 NOTE — Telephone Encounter (Signed)
Called and left patient a voicemail letting him know prescription was sent to pharmacy.

## 2015-11-05 ENCOUNTER — Telehealth: Payer: Self-pay | Admitting: Family Medicine

## 2015-11-05 NOTE — Telephone Encounter (Signed)
Routing to provider  

## 2015-11-05 NOTE — Telephone Encounter (Signed)
Pt called stated Crestor is no longer covered by his insurance, wants to know if this can be changed to Fluvastatin or Atorvastatin. Pharm is [facility] on [address] Thanks.

## 2015-11-06 NOTE — Telephone Encounter (Signed)
Please ask him to schedule an appt I reviewed his chart He was due to be seen by Dr. Dossie Arbourrissman in August He has not had labs since last April 15 minute visit, cholesterol please Have him come fasting, we'll do lipids in house and see what we can switch him to for insurance Thanks

## 2015-11-08 NOTE — Telephone Encounter (Signed)
Patient scheduled an appointment with MAC 12/01/15.

## 2015-11-08 NOTE — Telephone Encounter (Signed)
I called pt to offer him an appointment. Patient stated he would wait to see Dr. Dossie Arbourrissman. Patient scheduled to see MAC on 11/30/14. Thanks.

## 2015-11-30 ENCOUNTER — Ambulatory Visit: Payer: 59 | Admitting: Family Medicine

## 2015-12-01 ENCOUNTER — Encounter: Payer: Self-pay | Admitting: Family Medicine

## 2015-12-01 ENCOUNTER — Ambulatory Visit (INDEPENDENT_AMBULATORY_CARE_PROVIDER_SITE_OTHER): Payer: 59 | Admitting: Family Medicine

## 2015-12-01 VITALS — BP 111/74 | HR 66 | Temp 98.0°F | Ht 73.0 in | Wt 209.0 lb

## 2015-12-01 DIAGNOSIS — F329 Major depressive disorder, single episode, unspecified: Secondary | ICD-10-CM

## 2015-12-01 DIAGNOSIS — I10 Essential (primary) hypertension: Secondary | ICD-10-CM

## 2015-12-01 DIAGNOSIS — F32A Depression, unspecified: Secondary | ICD-10-CM

## 2015-12-01 DIAGNOSIS — E785 Hyperlipidemia, unspecified: Secondary | ICD-10-CM | POA: Diagnosis not present

## 2015-12-01 LAB — LP+ALT+AST PICCOLO, WAIVED
ALT (SGPT) Piccolo, Waived: 35 U/L (ref 10–47)
AST (SGOT) Piccolo, Waived: 42 U/L — ABNORMAL HIGH (ref 11–38)

## 2015-12-01 MED ORDER — BENAZEPRIL HCL 20 MG PO TABS: 20.0000 mg | ORAL_TABLET | Freq: Every day | ORAL | Status: DC

## 2015-12-01 MED ORDER — ATORVASTATIN CALCIUM 40 MG PO TABS: 40.0000 mg | ORAL_TABLET | Freq: Every day | ORAL | Status: DC

## 2015-12-01 NOTE — Progress Notes (Signed)
BP 111/74 mmHg  Pulse 66  Temp(Src) 98 F (36.7 C)  Ht  (1.854 m)  Wt 209 lb (94.802 kg)  BMI 27.58 kg/m2  SpO2 98%   Subjective:    Patient ID: Cameron Nelson, male    DOB: 1966/02/26, 50 y.o.   MRN: [id number]  HPI: Cameron Nelson is a 50 y.o. male  Chief Complaint  Patient presents with  . Hyperlipidemia  . Hypertension   patient follow-up blood pressure doing well with medication no complaints side effects taken faithfully Cholesterol taking Crestor 10 mg had been off of it for several weeks because of the cause but is been back on it for 2 weeks and interested in changing to another medication or no side effects hasn't tried other medicines.   Relevant past medical, surgical, family and social history reviewed and updated as indicated. Interim medical history since our last visit reviewed. Allergies and medications reviewed and updated.  Review of Systems  Constitutional: Negative.   Respiratory: Negative.   Cardiovascular: Negative.     Per HPI unless specifically indicated above     Objective:    BP 111/74 mmHg  Pulse 66  Temp(Src) 98 F (36.7 C)  Ht  (1.854 m)  Wt 209 lb (94.802 kg)  BMI 27.58 kg/m2  SpO2 98%  Wt Readings from Last 3 Encounters:  12/01/15 209 lb (94.802 kg)  04/20/15 222 lb (100.699 kg)  02/15/15 219 lb (99.338 kg)    Physical Exam  Constitutional: He is oriented to person, place, and time. He appears well-developed and well-nourished. No distress.  HENT:  Head: Normocephalic and atraumatic.  Right Ear: Hearing normal.  Left Ear: Hearing normal.  Nose: Nose normal.  Eyes: Conjunctivae and lids are normal. Right eye exhibits no discharge. Left eye exhibits no discharge. No scleral icterus.  Cardiovascular: Normal rate, regular rhythm and normal heart sounds.   Pulmonary/Chest: Effort normal and breath sounds normal. No respiratory distress.  Musculoskeletal: Normal range of motion.  Neurological: He is alert and oriented  to person, place, and time.  Skin: Skin is intact. No rash noted.  Psychiatric: He has a normal mood and affect. His speech is normal and behavior is normal. Judgment and thought content normal. Cognition and memory are normal.        Assessment & Plan:   Problem List Items Addressed This Visit      Cardiovascular and Mediastinum   Hypertension    The current medical regimen is effective;  continue present plan and medications.       Relevant Medications   atorvastatin (LIPITOR) 40 MG tablet   benazepril (LOTENSIN) 20 MG tablet     Other   Hyperlipidemia    Will change to lipitor  for lipids      Relevant Medications   atorvastatin (LIPITOR) 40 MG tablet   benazepril (LOTENSIN) 20 MG tablet   Depression    Resolved doing well not on meds       Other Visit Diagnoses    Essential hypertension, benign    -  Primary    Relevant Medications    atorvastatin (LIPITOR) 40 MG tablet    benazepril (LOTENSIN) 20 MG tablet    Other Relevant Orders    LP+ALT+AST Piccolo, Waived    Basic metabolic panel    Hyperlipemia        Relevant Medications    atorvastatin (LIPITOR) 40 MG tablet    benazepril (LOTENSIN) 20 MG tablet    Other  Relevant Orders    LP+ALT+AST Piccolo, Waived    Basic metabolic panel        Follow up plan: Return in about 3 months (around 02/28/2016) for Physical Exam.

## 2015-12-01 NOTE — Assessment & Plan Note (Signed)
Will change to lipitor  for lipids

## 2015-12-01 NOTE — Assessment & Plan Note (Signed)
The current medical regimen is effective;  continue present plan and medications.  

## 2015-12-01 NOTE — Assessment & Plan Note (Signed)
Resolved doing well not on meds

## 2015-12-02 ENCOUNTER — Encounter: Payer: Self-pay | Admitting: Family Medicine

## 2015-12-02 LAB — BASIC METABOLIC PANEL
BUN / CREAT RATIO: 12 (ref 9–20)
BUN: 11 mg/dL (ref 6–24)
CO2: 22 mmol/L (ref 18–29)
Calcium: 9 mg/dL (ref 8.7–10.2)
Chloride: 101 mmol/L (ref 96–106)
Creatinine, Ser: 0.93 mg/dL (ref 0.76–1.27)
GFR, EST AFRICAN AMERICAN: 113 mL/min/{1.73_m2} (ref >59)
GFR, EST NON AFRICAN AMERICAN: 97 mL/min/{1.73_m2} (ref >59)
Glucose: 100 mg/dL — ABNORMAL HIGH (ref 65–99)
POTASSIUM: 4.3 mmol/L (ref 3.5–5.2)
SODIUM: 140 mmol/L (ref 134–144)

## 2015-12-07 LAB — SPECIMEN STATUS REPORT

## 2016-02-17 ENCOUNTER — Encounter: Payer: Self-pay | Admitting: Family Medicine

## 2016-02-17 ENCOUNTER — Ambulatory Visit (INDEPENDENT_AMBULATORY_CARE_PROVIDER_SITE_OTHER): Payer: 59 | Admitting: Family Medicine

## 2016-02-17 VITALS — BP 114/71 | HR 67 | Temp 98.1°F | Ht 72.2 in | Wt 211.0 lb

## 2016-02-17 DIAGNOSIS — E785 Hyperlipidemia, unspecified: Secondary | ICD-10-CM

## 2016-02-17 DIAGNOSIS — M1612 Unilateral primary osteoarthritis, left hip: Secondary | ICD-10-CM

## 2016-02-17 DIAGNOSIS — Z0000 Encounter for general adult medical examination without abnormal findings: Secondary | ICD-10-CM

## 2016-02-17 DIAGNOSIS — I10 Essential (primary) hypertension: Secondary | ICD-10-CM | POA: Diagnosis not present

## 2016-02-17 LAB — URINALYSIS, ROUTINE W REFLEX MICROSCOPIC
Bilirubin, UA: NEGATIVE
Glucose, UA: NEGATIVE
KETONES UA: NEGATIVE
Leukocytes, UA: NEGATIVE
NITRITE UA: NEGATIVE
Protein, UA: NEGATIVE
RBC, UA: NEGATIVE
Specific Gravity, UA: <1.005 — ABNORMAL LOW (ref 1.005–1.030)
UUROB: 0.2 mg/dL (ref 0.2–1.0)
pH, UA: 6.5 (ref 5.0–7.5)

## 2016-02-17 MED ORDER — ATORVASTATIN CALCIUM 40 MG PO TABS: 40.0000 mg | ORAL_TABLET | Freq: Every day | ORAL | Status: DC

## 2016-02-17 MED ORDER — BENAZEPRIL HCL 20 MG PO TABS: 20.0000 mg | ORAL_TABLET | Freq: Every day | ORAL | Status: DC

## 2016-02-17 NOTE — Progress Notes (Signed)
BP 114/71 mmHg  Pulse 67  Temp(Src) 98.1 F (36.7 C)  Ht 6' 0.2" (1.834 m)  Wt 211 lb (95.709 kg)  BMI 28.45 kg/m2  SpO2 99%   Subjective:    Patient ID: Cameron Nelson, male    DOB: November 19, 1965, 50 y.o.   MRN: [id number]  HPI: Cameron Nelson is a 50 y.o. male  Chief Complaint  Patient presents with  . Annual Exam   Patient doing well taking medications faithfully with no side effects good control blood pressure Nerves are doing well with medications Lifestyle management for nerve control.  Relevant past medical, surgical, family and social history reviewed and updated as indicated. Interim medical history since our last visit reviewed. Allergies and medications reviewed and updated.  Review of Systems  Constitutional: Negative.   HENT: Negative.   Eyes: Negative.   Respiratory: Negative.   Cardiovascular: Negative.   Gastrointestinal: Negative.   Endocrine: Negative.   Genitourinary: Negative.   Musculoskeletal: Negative.   Skin: Negative.   Allergic/Immunologic: Negative.   Neurological: Negative.   Hematological: Negative.   Psychiatric/Behavioral: Negative.     Per HPI unless specifically indicated above     Objective:    BP 114/71 mmHg  Pulse 67  Temp(Src) 98.1 F (36.7 C)  Ht 6' 0.2" (1.834 m)  Wt 211 lb (95.709 kg)  BMI 28.45 kg/m2  SpO2 99%  Wt Readings from Last 3 Encounters:  02/17/16 211 lb (95.709 kg)  12/01/15 209 lb (94.802 kg)  04/20/15 222 lb (100.699 kg)    Physical Exam  Constitutional: He is oriented to person, place, and time. He appears well-developed and well-nourished.  HENT:  Head: Normocephalic and atraumatic.  Right Ear: External ear normal.  Left Ear: External ear normal.  Eyes: Conjunctivae and EOM are normal. Pupils are equal, round, and reactive to light.  Neck: Normal range of motion. Neck supple.  Cardiovascular: Normal rate, regular rhythm, normal heart sounds and intact distal pulses.   Pulmonary/Chest: Effort  normal and breath sounds normal.  Abdominal: Soft. Bowel sounds are normal. There is no splenomegaly or hepatomegaly.  Genitourinary: Rectum normal, prostate normal and penis normal.  Musculoskeletal: Normal range of motion.  Neurological: He is alert and oriented to person, place, and time. He has normal reflexes.  Skin: No rash noted. No erythema.  Psychiatric: He has a normal mood and affect. His behavior is normal. Judgment and thought content normal.    Results for orders placed or performed in visit on 12/01/15  LP+ALT+AST Piccolo, [employer]  Result Value Ref Range   ALT (SGPT) Piccolo, Waived 35 10 - 47 U/L   AST (SGOT) Piccolo, Waived 42 (H) 11 - 38 U/L   Cholesterol Piccolo, Waived CANCELED    HDL Chol Piccolo, Waived CANCELED    Triglycerides Piccolo,Waived CANCELED   Basic metabolic panel  Result Value Ref Range   Glucose 100 (H) 65 - 99 mg/dL   BUN 11 6 - 24 mg/dL   Creatinine, Ser 4.09 0.76 - 1.27 mg/dL   GFR calc non Af Amer 97 >59 mL/min/1.73   GFR calc Af Amer 113 >59 mL/min/1.73   BUN/Creatinine Ratio 12 9 - 20   Sodium 140 134 - 144 mmol/L   Potassium 4.3 3.5 - 5.2 mmol/L   Chloride 101 96 - 106 mmol/L   CO2 22 18 - 29 mmol/L   Calcium 9.0 8.7 - 10.2 mg/dL  Specimen status report  Result Value Ref Range   specimen status report Comment  Assessment & Plan:   Problem List Items Addressed This Visit      Cardiovascular and Mediastinum   Hypertension    The current medical regimen is effective;  continue present plan and medications.       Relevant Medications   atorvastatin (LIPITOR) 40 MG tablet   benazepril (LOTENSIN) 20 MG tablet     Musculoskeletal and Integument   OA (osteoarthritis) of hip     Other   Hyperlipidemia    The current medical regimen is effective;  continue present plan and medications.       Relevant Medications   atorvastatin (LIPITOR) 40 MG tablet   benazepril (LOTENSIN) 20 MG tablet    Other Visit Diagnoses     Routine general medical examination at a health care facility    -  Primary    Relevant Orders    CBC with Differential/Platelet    Comprehensive metabolic panel    Lipid Panel w/o Chol/HDL Ratio    PSA    TSH    Urinalysis, Routine w reflex microscopic (not [facility])        Follow up plan: Return for 6 mo BMP, lipids, alt, ast.

## 2016-02-17 NOTE — Assessment & Plan Note (Signed)
The current medical regimen is effective;  continue present plan and medications.  

## 2016-02-17 NOTE — Addendum Note (Signed)
Addended by: Lurlean HornsWILSON, NANCY H on: 02/17/2016 03:39 PM   Modules accepted: [employer]

## 2016-02-18 LAB — CBC WITH DIFFERENTIAL/PLATELET
BASOS ABS: 0 10*3/uL (ref 0.0–0.2)
BASOS: 0 %
EOS (ABSOLUTE): 0.2 10*3/uL (ref 0.0–0.4)
Eos: 3 %
HEMOGLOBIN: 15.8 g/dL (ref 12.6–17.7)
Hematocrit: 46.8 % (ref 37.5–51.0)
Immature Grans (Abs): 0 10*3/uL (ref 0.0–0.1)
Immature Granulocytes: 0 %
LYMPHS ABS: 1.6 10*3/uL (ref 0.7–3.1)
Lymphs: 22 %
MCH: 32.3 pg (ref 26.6–33.0)
MCHC: 33.8 g/dL (ref 31.5–35.7)
MCV: 96 fL (ref 79–97)
MONOCYTES: 9 %
Monocytes Absolute: 0.6 10*3/uL (ref 0.1–0.9)
NEUTROS ABS: 4.7 10*3/uL (ref 1.4–7.0)
Neutrophils: 66 %
Platelets: 223 10*3/uL (ref 150–379)
RBC: 4.89 x10E6/uL (ref 4.14–5.80)
RDW: 14.5 % (ref 12.3–15.4)
WBC: 7.2 10*3/uL (ref 3.4–10.8)

## 2016-02-18 LAB — COMPREHENSIVE METABOLIC PANEL
A/G RATIO: 1.8 (ref 1.2–2.2)
ALBUMIN: 4.2 g/dL (ref 3.5–5.5)
ALK PHOS: 77 IU/L (ref 39–117)
ALT: 31 IU/L (ref 0–44)
AST: 25 IU/L (ref 0–40)
BILIRUBIN TOTAL: 0.4 mg/dL (ref 0.0–1.2)
BUN / CREAT RATIO: 7 — AB (ref 9–20)
BUN: 6 mg/dL (ref 6–24)
CHLORIDE: 99 mmol/L (ref 96–106)
CO2: 25 mmol/L (ref 18–29)
Calcium: 8.9 mg/dL (ref 8.7–10.2)
Creatinine, Ser: 0.84 mg/dL (ref 0.76–1.27)
GFR calc non Af Amer: 104 mL/min/{1.73_m2} (ref >59)
GFR, EST AFRICAN AMERICAN: 121 mL/min/{1.73_m2} (ref >59)
GLOBULIN, TOTAL: 2.3 g/dL (ref 1.5–4.5)
GLUCOSE: 93 mg/dL (ref 65–99)
POTASSIUM: 4.2 mmol/L (ref 3.5–5.2)
SODIUM: 137 mmol/L (ref 134–144)
TOTAL PROTEIN: 6.5 g/dL (ref 6.0–8.5)

## 2016-02-18 LAB — LIPID PANEL W/O CHOL/HDL RATIO
Cholesterol, Total: 130 mg/dL (ref 100–199)
HDL: 44 mg/dL (ref >39)
LDL Calculated: 50 mg/dL (ref 0–99)
TRIGLYCERIDES: 180 mg/dL — AB (ref 0–149)
VLDL Cholesterol Cal: 36 mg/dL (ref 5–40)

## 2016-02-18 LAB — PSA: Prostate Specific Ag, Serum: 0.8 ng/mL (ref 0.0–4.0)

## 2016-02-18 LAB — TSH: TSH: 1.19 u[IU]/mL (ref 0.450–4.500)

## 2016-02-21 ENCOUNTER — Encounter: Payer: Self-pay | Admitting: Family Medicine

## 2016-07-26 ENCOUNTER — Encounter (INDEPENDENT_AMBULATORY_CARE_PROVIDER_SITE_OTHER): Payer: Self-pay

## 2016-08-01 ENCOUNTER — Ambulatory Visit (INDEPENDENT_AMBULATORY_CARE_PROVIDER_SITE_OTHER): Payer: BC Managed Care – PPO | Admitting: Family Medicine

## 2016-08-01 ENCOUNTER — Encounter: Payer: Self-pay | Admitting: Family Medicine

## 2016-08-01 VITALS — BP 126/84 | HR 69 | Temp 98.4°F | Ht 73.4 in | Wt 199.6 lb

## 2016-08-01 DIAGNOSIS — F331 Major depressive disorder, recurrent, moderate: Secondary | ICD-10-CM

## 2016-08-01 MED ORDER — BUPROPION HCL ER (SR) 150 MG PO TB12: 150.0000 mg | ORAL_TABLET | Freq: Two times a day (BID) | ORAL | 2 refills | Status: DC

## 2016-08-01 NOTE — Progress Notes (Signed)
BP 126/84 (BP Location: Left Arm, Patient Position: Sitting, Cuff Size: Large)   Pulse 69   Temp 98.4 F (36.9 C)   Ht 6' 1.4" (1.864 m)   Wt 199 lb 9.6 oz (90.5 kg)   SpO2 98%   BMI 26.05 kg/m    Subjective:    Patient ID: Cameron Nelson, male    DOB: October 12, 1966, 50 y.o.   MRN: [id number]  HPI: Cameron Nelson is a 50 y.o. male  Chief Complaint  Patient presents with  . Depression    pt wants to talk about being put on an antidepressant to help with not smoking aniexty and mood swings.   . 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 No suicidal ideation or specific plans. These problems been ongoing all summer and now exacerbated.  Relevant past medical, surgical, family and social history reviewed and updated as indicated. Interim medical history since our last visit reviewed. Allergies and medications reviewed and updated.  Review of Systems  Constitutional: Positive for activity change, appetite change and fatigue. Negative for chills, diaphoresis and fever.  Respiratory: Negative.   Cardiovascular: Negative.     Per HPI unless specifically indicated above     Objective:    BP 126/84 (BP Location: Left Arm, Patient Position: Sitting, Cuff Size: Large)   Pulse 69   Temp 98.4 F (36.9 C)   Ht 6' 1.4" (1.864 m)   Wt 199 lb 9.6 oz (90.5 kg)   SpO2 98%   BMI 26.05 kg/m   Wt Readings from Last 3 Encounters:  08/01/16 199 lb 9.6 oz (90.5 kg)  02/17/16 211 lb (95.7 kg)  12/01/15 209 lb (94.8 kg)    Physical Exam  Constitutional: He is oriented to person, place, and time. He appears well-developed and well-nourished. No distress.  HENT:  Head: Normocephalic and atraumatic.  Right Ear: Hearing normal.  Left Ear: Hearing normal.  Nose: Nose normal.  Eyes: Conjunctivae and lids are normal. Right eye exhibits no discharge.  Left eye exhibits no discharge. No scleral icterus.  Cardiovascular: Normal rate, regular rhythm and normal heart sounds.   Pulmonary/Chest: Effort normal and breath sounds normal. No respiratory distress.  Musculoskeletal: Normal range of motion.  Neurological: He is alert and oriented to person, place, and time.  Skin: Skin is intact. No rash noted.  Psychiatric: His speech is normal and behavior is normal. Judgment and thought content normal. Cognition and memory are normal.  Flat affect    Results for orders placed or performed in visit on 02/17/16  CBC with Differential/Platelet  Result Value Ref Range   WBC 7.2 3.4 - 10.8 x10E3/uL   RBC 4.89 4.14 - 5.80 x10E6/uL   Hemoglobin 15.8 12.6 - 17.7 g/dL   Hematocrit 40.9 81.1 - 51.0 %   MCV 96 79 - 97 fL   MCH 32.3 26.6 - 33.0 pg   MCHC 33.8 31.5 - 35.7 g/dL   RDW 91.4 78.2 - 95.6 %   Platelets 223 150 - 379 x10E3/uL   Neutrophils 66 %   Lymphs 22 %   Monocytes 9 %   Eos 3 %   Basos 0 %   Neutrophils Absolute 4.7 1.4 - 7.0 x10E3/uL   Lymphocytes Absolute 1.6 0.7 - 3.1 x10E3/uL   Monocytes Absolute 0.6 0.1 - 0.9 x10E3/uL   EOS (ABSOLUTE) 0.2 0.0 -  0.4 x10E3/uL   Basophils Absolute 0.0 0.0 - 0.2 x10E3/uL   Immature Granulocytes 0 %   Immature Grans (Abs) 0.0 0.0 - 0.1 x10E3/uL  Comprehensive metabolic panel  Result Value Ref Range   Glucose 93 65 - 99 mg/dL   BUN 6 6 - 24 mg/dL   Creatinine, Ser 4.540.84 0.76 - 1.27 mg/dL   GFR calc non Af Amer 104 >59 mL/min/1.73   GFR calc Af Amer 121 >59 mL/min/1.73   BUN/Creatinine Ratio 7 (L) 9 - 20   Sodium 137 134 - 144 mmol/L   Potassium 4.2 3.5 - 5.2 mmol/L   Chloride 99 96 - 106 mmol/L   CO2 25 18 - 29 mmol/L   Calcium 8.9 8.7 - 10.2 mg/dL   Total Protein 6.5 6.0 - 8.5 g/dL   Albumin 4.2 3.5 - 5.5 g/dL   Globulin, Total 2.3 1.5 - 4.5 g/dL   Albumin/Globulin Ratio 1.8 1.2 - 2.2   Bilirubin Total 0.4 0.0 - 1.2 mg/dL   Alkaline Phosphatase 77 39 - 117 IU/L   AST 25 0 - 40 IU/L    ALT 31 0 - 44 IU/L  Lipid Panel w/o Chol/HDL Ratio  Result Value Ref Range   Cholesterol, Total 130 100 - 199 mg/dL   Triglycerides 098180 (H) 0 - 149 mg/dL   HDL 44 >11>39 mg/dL   VLDL Cholesterol Cal 36 5 - 40 mg/dL   LDL Calculated 50 0 - 99 mg/dL  PSA  Result Value Ref Range   Prostate Specific Ag, Serum 0.8 0.0 - 4.0 ng/mL  TSH  Result Value Ref Range   TSH 1.190 0.450 - 4.500 uIU/mL  Urinalysis, Routine w reflex microscopic (not at [facility])  Result Value Ref Range   Specific Gravity, UA <1.005 (L) 1.005 - 1.030   pH, UA 6.5 5.0 - 7.5   Color, UA Yellow Yellow   Appearance Ur Clear Clear   Leukocytes, UA Negative Negative   Protein, UA Negative Negative/Trace   Glucose, UA Negative Negative   Ketones, UA Negative Negative   RBC, UA Negative Negative   Bilirubin, UA Negative Negative   Urobilinogen, Ur 0.2 0.2 - 1.0 mg/dL   Nitrite, UA Negative Negative      Assessment & Plan:   Problem List Items Addressed This Visit      Other   Depression - Primary    Reviewed depression care and treatment patient's had SSRI in the past with sexual dysfunction will use Wellbutrin patient education given Will follow-up in 2-3 weeks review blood pressure cholesterol and medication.      Relevant Medications   buPROPion (WELLBUTRIN SR) 150 MG 12 hr tablet    Other Visit Diagnoses   None.      Follow up plan: Return in about 2 weeks (around 08/15/2016) for BMP,  Lipids, ALT, AST.

## 2016-08-01 NOTE — Assessment & Plan Note (Signed)
Reviewed depression care and treatment patient's had SSRI in the past with sexual dysfunction will use Wellbutrin patient education given Will follow-up in 2-3 weeks review blood pressure cholesterol and medication.

## 2016-08-30 ENCOUNTER — Ambulatory Visit: Payer: 59 | Admitting: Family Medicine

## 2016-08-31 ENCOUNTER — Ambulatory Visit (INDEPENDENT_AMBULATORY_CARE_PROVIDER_SITE_OTHER): Payer: BC Managed Care – PPO | Admitting: Family Medicine

## 2016-08-31 ENCOUNTER — Encounter: Payer: Self-pay | Admitting: Family Medicine

## 2016-08-31 VITALS — BP 134/87 | HR 80 | Temp 98.0°F | Ht 73.3 in | Wt 196.6 lb

## 2016-08-31 DIAGNOSIS — F331 Major depressive disorder, recurrent, moderate: Secondary | ICD-10-CM | POA: Diagnosis not present

## 2016-08-31 DIAGNOSIS — I10 Essential (primary) hypertension: Secondary | ICD-10-CM

## 2016-08-31 DIAGNOSIS — E785 Hyperlipidemia, unspecified: Secondary | ICD-10-CM

## 2016-08-31 LAB — LP+ALT+AST PICCOLO, WAIVED
ALT (SGPT) PICCOLO, WAIVED: 41 U/L (ref 10–47)
AST (SGOT) PICCOLO, WAIVED: 38 U/L (ref 11–38)
CHOLESTEROL PICCOLO, WAIVED: 146 mg/dL (ref <200)
Chol/HDL Ratio Piccolo,Waive: 2.6 mg/dL
HDL Chol Piccolo, Waived: 57 mg/dL — ABNORMAL LOW (ref >59)
LDL Chol Calc Piccolo Waived: 66 mg/dL (ref <100)
Triglycerides Piccolo,Waived: 117 mg/dL (ref <150)
VLDL Chol Calc Piccolo,Waive: 23 mg/dL (ref <30)

## 2016-08-31 MED ORDER — BUPROPION HCL ER (SR) 150 MG PO TB12: 150.0000 mg | ORAL_TABLET | Freq: Two times a day (BID) | ORAL | 2 refills | Status: DC

## 2016-08-31 NOTE — Progress Notes (Signed)
BP 134/87 (BP Location: Left Arm, Patient Position: Sitting, Cuff Size: Large)   Pulse 80   Temp 98 F (36.7 C)   Ht 6' 1.3" (1.862 m) Comment: pt had shoes on  Wt 196 lb 9.6 oz (89.2 kg) Comment: pt had shoes on  SpO2 97%   BMI 25.73 kg/m    Subjective:    Patient ID: Cameron Nelson, male    DOB: 04/20/1966, 50 y.o.   MRN: [id number]  HPI: Cameron Nelson is a 50 y.o. male  Chief Complaint  Patient presents with  . Depression  . Hyperlipidemia  . Hypertension  Patient follow-up depression somewhat improved his been able to cut back on smoking sleeping doing okay anxiety is no worse. Sexual dysfunction has resolved and doing well with bupropion Reports about 50% improved. Blood pressure no concerns or questions doing well cholesterol doing well no concerns or questions with medicines taken faithfully without side effects.   Relevant past medical, surgical, family and social history reviewed and updated as indicated. Interim medical history since our last visit reviewed. Allergies and medications reviewed and updated.  Review of Systems  Constitutional: Negative.   Respiratory: Negative.   Cardiovascular: Negative.     Per HPI unless specifically indicated above     Objective:    BP 134/87 (BP Location: Left Arm, Patient Position: Sitting, Cuff Size: Large)   Pulse 80   Temp 98 F (36.7 C)   Ht 6' 1.3" (1.862 m) Comment: pt had shoes on  Wt 196 lb 9.6 oz (89.2 kg) Comment: pt had shoes on  SpO2 97%   BMI 25.73 kg/m   Wt Readings from Last 3 Encounters:  08/31/16 196 lb 9.6 oz (89.2 kg)  08/01/16 199 lb 9.6 oz (90.5 kg)  02/17/16 211 lb (95.7 kg)    Physical Exam  Constitutional: He is oriented to person, place, and time. He appears well-developed and well-nourished. No distress.  HENT:  Head: Normocephalic and atraumatic.  Right Ear: Hearing normal.  Left Ear: Hearing normal.  Nose: Nose normal.  Eyes: Conjunctivae and lids are normal. Right eye exhibits  no discharge. Left eye exhibits no discharge. No scleral icterus.  Cardiovascular: Normal rate, regular rhythm and normal heart sounds.   Pulmonary/Chest: Effort normal and breath sounds normal. No respiratory distress.  Musculoskeletal: Normal range of motion.  Neurological: He is alert and oriented to person, place, and time.  Skin: Skin is intact. No rash noted.  Psychiatric: He has a normal mood and affect. His speech is normal and behavior is normal. Judgment and thought content normal. Cognition and memory are normal.    Results for orders placed or performed in visit on 02/17/16  CBC with Differential/Platelet  Result Value Ref Range   WBC 7.2 3.4 - 10.8 x10E3/uL   RBC 4.89 4.14 - 5.80 x10E6/uL   Hemoglobin 15.8 12.6 - 17.7 g/dL   Hematocrit 46.946.8 62.937.5 - 51.0 %   MCV 96 79 - 97 fL   MCH 32.3 26.6 - 33.0 pg   MCHC 33.8 31.5 - 35.7 g/dL   RDW 52.814.5 41.312.3 - 24.415.4 %   Platelets 223 150 - 379 x10E3/uL   Neutrophils 66 %   Lymphs 22 %   Monocytes 9 %   Eos 3 %   Basos 0 %   Neutrophils Absolute 4.7 1.4 - 7.0 x10E3/uL   Lymphocytes Absolute 1.6 0.7 - 3.1 x10E3/uL   Monocytes Absolute 0.6 0.1 - 0.9 x10E3/uL   EOS (ABSOLUTE) 0.2 0.0 -  0.4 x10E3/uL   Basophils Absolute 0.0 0.0 - 0.2 x10E3/uL   Immature Granulocytes 0 %   Immature Grans (Abs) 0.0 0.0 - 0.1 x10E3/uL  Comprehensive metabolic panel  Result Value Ref Range   Glucose 93 65 - 99 mg/dL   BUN 6 6 - 24 mg/dL   Creatinine, Ser 6.210.84 0.76 - 1.27 mg/dL   GFR calc non Af Amer 104 >59 mL/min/1.73   GFR calc Af Amer 121 >59 mL/min/1.73   BUN/Creatinine Ratio 7 (L) 9 - 20   Sodium 137 134 - 144 mmol/L   Potassium 4.2 3.5 - 5.2 mmol/L   Chloride 99 96 - 106 mmol/L   CO2 25 18 - 29 mmol/L   Calcium 8.9 8.7 - 10.2 mg/dL   Total Protein 6.5 6.0 - 8.5 g/dL   Albumin 4.2 3.5 - 5.5 g/dL   Globulin, Total 2.3 1.5 - 4.5 g/dL   Albumin/Globulin Ratio 1.8 1.2 - 2.2   Bilirubin Total 0.4 0.0 - 1.2 mg/dL   Alkaline Phosphatase 77 39 -  117 IU/L   AST 25 0 - 40 IU/L   ALT 31 0 - 44 IU/L  Lipid Panel w/o Chol/HDL Ratio  Result Value Ref Range   Cholesterol, Total 130 100 - 199 mg/dL   Triglycerides 308180 (H) 0 - 149 mg/dL   HDL 44 >65>39 mg/dL   VLDL Cholesterol Cal 36 5 - 40 mg/dL   LDL Calculated 50 0 - 99 mg/dL  PSA  Result Value Ref Range   Prostate Specific Ag, Serum 0.8 0.0 - 4.0 ng/mL  TSH  Result Value Ref Range   TSH 1.190 0.450 - 4.500 uIU/mL  Urinalysis, Routine w reflex microscopic (not at [facility])  Result Value Ref Range   Specific Gravity, UA <1.005 (L) 1.005 - 1.030   pH, UA 6.5 5.0 - 7.5   Color, UA Yellow Yellow   Appearance Ur Clear Clear   Leukocytes, UA Negative Negative   Protein, UA Negative Negative/Trace   Glucose, UA Negative Negative   Ketones, UA Negative Negative   RBC, UA Negative Negative   Bilirubin, UA Negative Negative   Urobilinogen, Ur 0.2 0.2 - 1.0 mg/dL   Nitrite, UA Negative Negative      Assessment & Plan:   Problem List Items Addressed This Visit      Cardiovascular and Mediastinum   Hypertension - Primary    The current medical regimen is effective;  continue present plan and medications.       Relevant Orders   Basic metabolic panel     Other   Hyperlipidemia    The current medical regimen is effective;  continue present plan and medications.       Relevant Orders   LP+ALT+AST Piccolo, Waived   Depression    Showing improvement with resolution of side effects will continue current medication      Relevant Medications   buPROPion (WELLBUTRIN SR) 150 MG 12 hr tablet       Follow up plan: Return in about 4 weeks (around 09/28/2016) for depression check.

## 2016-08-31 NOTE — Assessment & Plan Note (Signed)
Showing improvement with resolution of side effects will continue current medication

## 2016-08-31 NOTE — Assessment & Plan Note (Signed)
The current medical regimen is effective;  continue present plan and medications.  

## 2016-09-01 LAB — BASIC METABOLIC PANEL
BUN / CREAT RATIO: 14 (ref 9–20)
BUN: 13 mg/dL (ref 6–24)
CO2: 27 mmol/L (ref 18–29)
CREATININE: 0.94 mg/dL (ref 0.76–1.27)
Calcium: 9.7 mg/dL (ref 8.7–10.2)
Chloride: 100 mmol/L (ref 96–106)
GFR calc Af Amer: 110 mL/min/{1.73_m2} (ref >59)
GFR, EST NON AFRICAN AMERICAN: 95 mL/min/{1.73_m2} (ref >59)
GLUCOSE: 87 mg/dL (ref 65–99)
Potassium: 4.5 mmol/L (ref 3.5–5.2)
SODIUM: 141 mmol/L (ref 134–144)

## 2016-09-04 ENCOUNTER — Encounter: Payer: Self-pay | Admitting: Family Medicine

## 2016-10-12 ENCOUNTER — Ambulatory Visit (INDEPENDENT_AMBULATORY_CARE_PROVIDER_SITE_OTHER): Payer: BC Managed Care – PPO | Admitting: Family Medicine

## 2016-10-12 ENCOUNTER — Encounter: Payer: Self-pay | Admitting: Family Medicine

## 2016-10-12 ENCOUNTER — Ambulatory Visit: Payer: BC Managed Care – PPO | Admitting: Family Medicine

## 2016-10-12 DIAGNOSIS — F331 Major depressive disorder, recurrent, moderate: Secondary | ICD-10-CM

## 2016-10-12 DIAGNOSIS — E7800 Pure hypercholesterolemia, unspecified: Secondary | ICD-10-CM | POA: Diagnosis not present

## 2016-10-12 DIAGNOSIS — I10 Essential (primary) hypertension: Secondary | ICD-10-CM

## 2016-10-12 MED ORDER — BUPROPION HCL ER (SR) 150 MG PO TB12: 150.0000 mg | ORAL_TABLET | Freq: Two times a day (BID) | ORAL | 6 refills | Status: DC

## 2016-10-12 NOTE — Assessment & Plan Note (Signed)
The current medical regimen is effective;  continue present plan and medications.  

## 2016-10-12 NOTE — Progress Notes (Signed)
BP 130/84 (BP Location: Left Arm, Patient Position: Sitting, Cuff Size: Large)   Pulse 76   Temp 97.9 F (36.6 C)   Wt 205 lb 6.4 oz (93.2 kg)   SpO2 98%   BMI 26.88 kg/m    Subjective:    Patient ID: Cameron Nelson, male    DOB: 02/29/1966, 50 y.o.   MRN: [id number]  HPI: Cameron Nelson is a 50 y.o. male  Chief Complaint  Patient presents with  . Depression  Follow-up doing well reports about 70% improved no side effects from medications taking medications faithfully without problems. Getting through Christmas season well. Blood pressure cholesterol medicines doing well with no issues. Relevant past medical, surgical, family and social history reviewed and updated as indicated. Interim medical history since our last visit reviewed. Allergies and medications reviewed and updated.  Review of Systems  Constitutional: Negative.   Respiratory: Negative.   Cardiovascular: Negative.     Per HPI unless specifically indicated above     Objective:    BP 130/84 (BP Location: Left Arm, Patient Position: Sitting, Cuff Size: Large)   Pulse 76   Temp 97.9 F (36.6 C)   Wt 205 lb 6.4 oz (93.2 kg)   SpO2 98%   BMI 26.88 kg/m   Wt Readings from Last 3 Encounters:  10/12/16 205 lb 6.4 oz (93.2 kg)  08/31/16 196 lb 9.6 oz (89.2 kg)  08/01/16 199 lb 9.6 oz (90.5 kg)    Physical Exam  Constitutional: He is oriented to person, place, and time. He appears well-developed and well-nourished. No distress.  HENT:  Head: Normocephalic and atraumatic.  Right Ear: Hearing normal.  Left Ear: Hearing normal.  Nose: Nose normal.  Eyes: Conjunctivae and lids are normal. Right eye exhibits no discharge. Left eye exhibits no discharge. No scleral icterus.  Cardiovascular: Normal rate, regular rhythm and normal heart sounds.   Pulmonary/Chest: Effort normal and breath sounds normal. No respiratory distress.  Musculoskeletal: Normal range of motion.  Neurological: He is alert and oriented to  person, place, and time.  Skin: Skin is intact. No rash noted.  Psychiatric: He has a normal mood and affect. His speech is normal and behavior is normal. Judgment and thought content normal. Cognition and memory are normal.    Results for orders placed or performed in visit on 08/31/16  Basic metabolic panel  Result Value Ref Range   Glucose 87 65 - 99 mg/dL   BUN 13 6 - 24 mg/dL   Creatinine, Ser 7.820.94 0.76 - 1.27 mg/dL   GFR calc non Af Amer 95 >59 mL/min/1.73   GFR calc Af Amer 110 >59 mL/min/1.73   BUN/Creatinine Ratio 14 9 - 20   Sodium 141 134 - 144 mmol/L   Potassium 4.5 3.5 - 5.2 mmol/L   Chloride 100 96 - 106 mmol/L   CO2 27 18 - 29 mmol/L   Calcium 9.7 8.7 - 10.2 mg/dL  LP+ALT+AST Piccolo, Waived  Result Value Ref Range   ALT (SGPT) Piccolo, Waived 41 10 - 47 U/L   AST (SGOT) Piccolo, Waived 38 11 - 38 U/L   Cholesterol Piccolo, Waived 146 <200 mg/dL   HDL Chol Piccolo, Waived 57 (L) >59 mg/dL   Triglycerides Piccolo,Waived 117 <150 mg/dL   Chol/HDL Ratio Piccolo,Waive 2.6 mg/dL   LDL Chol Calc Piccolo Waived 66 <100 mg/dL   VLDL Chol Calc Piccolo,Waive 23 <30 mg/dL      Assessment & Plan:   Problem List Items Addressed This Visit  Cardiovascular and Mediastinum   Hypertension    The current medical regimen is effective;  continue present plan and medications.          Other   Hyperlipidemia    The current medical regimen is effective;  continue present plan and medications.       Depression    The current medical regimen is effective;  continue present plan and medications.       Relevant Medications   buPROPion (WELLBUTRIN SR) 150 MG 12 hr tablet       Follow up plan: Return for April for PE.

## 2016-11-28 ENCOUNTER — Other Ambulatory Visit: Payer: Self-pay | Admitting: Family Medicine

## 2016-11-28 NOTE — Telephone Encounter (Signed)
Your patient 

## 2016-11-30 ENCOUNTER — Other Ambulatory Visit: Payer: Self-pay | Admitting: Family Medicine

## 2016-11-30 ENCOUNTER — Ambulatory Visit: Payer: BC Managed Care – PPO | Admitting: Family Medicine

## 2016-11-30 NOTE — Telephone Encounter (Signed)
Your patient 

## 2016-12-01 ENCOUNTER — Telehealth: Payer: Self-pay | Admitting: Family Medicine

## 2016-12-01 MED ORDER — ATORVASTATIN CALCIUM 40 MG PO TABS: 40.0000 mg | ORAL_TABLET | Freq: Every day | ORAL | 0 refills | Status: DC

## 2016-12-01 NOTE — Telephone Encounter (Signed)
This is Dr. Christell Faithrissman's patient.  She probably refilled the medication while Dr. Dossie Arbourrissman was out one day, so her name is on the last Rx.   Routing to Dr. Laural BenesJohnson  to advise.

## 2016-12-01 NOTE — Telephone Encounter (Signed)
Called patient.  Patient said that [facility] said he needed further refills.  Left message for [facility] pharmacist patient should not be out and should have 4 refills remaining of that medication.  Said to call with any further questions.

## 2016-12-01 NOTE — Telephone Encounter (Deleted)
Routing to provider  

## 2016-12-01 NOTE — Telephone Encounter (Signed)
He gave him a 15 month supply in April 2017. He should not be due.

## 2017-02-20 ENCOUNTER — Encounter: Payer: BC Managed Care – PPO | Admitting: Family Medicine

## 2017-02-27 ENCOUNTER — Other Ambulatory Visit: Payer: Self-pay | Admitting: Family Medicine

## 2017-02-27 NOTE — Telephone Encounter (Signed)
Your patient 

## 2017-04-17 ENCOUNTER — Ambulatory Visit (INDEPENDENT_AMBULATORY_CARE_PROVIDER_SITE_OTHER): Payer: BC Managed Care – PPO | Admitting: Family Medicine

## 2017-04-17 VITALS — BP 138/85 | HR 76 | Ht 73.62 in | Wt 212.0 lb

## 2017-04-17 DIAGNOSIS — Z0000 Encounter for general adult medical examination without abnormal findings: Secondary | ICD-10-CM | POA: Diagnosis not present

## 2017-04-17 DIAGNOSIS — E7800 Pure hypercholesterolemia, unspecified: Secondary | ICD-10-CM

## 2017-04-17 DIAGNOSIS — I10 Essential (primary) hypertension: Secondary | ICD-10-CM | POA: Diagnosis not present

## 2017-04-17 DIAGNOSIS — Z125 Encounter for screening for malignant neoplasm of prostate: Secondary | ICD-10-CM

## 2017-04-17 DIAGNOSIS — Z131 Encounter for screening for diabetes mellitus: Secondary | ICD-10-CM

## 2017-04-17 DIAGNOSIS — F331 Major depressive disorder, recurrent, moderate: Secondary | ICD-10-CM

## 2017-04-17 DIAGNOSIS — Z1329 Encounter for screening for other suspected endocrine disorder: Secondary | ICD-10-CM

## 2017-04-17 LAB — URINALYSIS, ROUTINE W REFLEX MICROSCOPIC
Bilirubin, UA: NEGATIVE
GLUCOSE, UA: NEGATIVE
Ketones, UA: NEGATIVE
Leukocytes, UA: NEGATIVE
NITRITE UA: NEGATIVE
Protein, UA: NEGATIVE
Specific Gravity, UA: 1.01 (ref 1.005–1.030)
Urobilinogen, Ur: 0.2 mg/dL (ref 0.2–1.0)
pH, UA: 6 (ref 5.0–7.5)

## 2017-04-17 LAB — MICROSCOPIC EXAMINATION: WBC UA: NONE SEEN /HPF (ref 0–?)

## 2017-04-17 MED ORDER — BUPROPION HCL ER (SR) 150 MG PO TB12: 150.0000 mg | ORAL_TABLET | Freq: Two times a day (BID) | ORAL | 4 refills | Status: DC

## 2017-04-17 MED ORDER — BENAZEPRIL HCL 20 MG PO TABS: 20.0000 mg | ORAL_TABLET | Freq: Every day | ORAL | 4 refills | Status: DC

## 2017-04-17 MED ORDER — ATORVASTATIN CALCIUM 40 MG PO TABS: 40.0000 mg | ORAL_TABLET | Freq: Every day | ORAL | 4 refills | Status: DC

## 2017-04-17 NOTE — Assessment & Plan Note (Signed)
The current medical regimen is effective;  continue present plan and medications.  

## 2017-04-17 NOTE — Progress Notes (Signed)
BP 138/85   Pulse 76   Ht 6' 1.62" (1.87 m)   Wt 212 lb (96.2 kg)   SpO2 98%   BMI 27.50 kg/m    Subjective:    Patient ID: Cameron Nelson, male    DOB: 10/27/1965, 51 y.o.   MRN: [id number]  HPI: Cameron Nelson is a 51 y.o. male  Chief Complaint  Patient presents with  . Annual Exam   Patient follow-up doing well with cholesterol taking medications without problems same with blood pressure and also with bupropion. Hip all in all is doing well.  Relevant past medical, surgical, family and social history reviewed and updated as indicated. Interim medical history since our last visit reviewed. Allergies and medications reviewed and updated.  Review of Systems  Constitutional: Negative.   HENT: Negative.   Eyes: Negative.   Respiratory: Negative.   Cardiovascular: Negative.   Gastrointestinal: Negative.   Endocrine: Negative.   Genitourinary: Negative.   Musculoskeletal: Negative.   Skin: Negative.   Allergic/Immunologic: Negative.   Neurological: Negative.   Hematological: Negative.   Psychiatric/Behavioral: Negative.     Per HPI unless specifically indicated above     Objective:    BP 138/85   Pulse 76   Ht 6' 1.62" (1.87 m)   Wt 212 lb (96.2 kg)   SpO2 98%   BMI 27.50 kg/m   Wt Readings from Last 3 Encounters:  04/17/17 212 lb (96.2 kg)  10/12/16 205 lb 6.4 oz (93.2 kg)  08/31/16 196 lb 9.6 oz (89.2 kg)    Physical Exam  Constitutional: He is oriented to person, place, and time. He appears well-developed and well-nourished.  HENT:  Head: Normocephalic.  Right Ear: External ear normal.  Left Ear: External ear normal.  Nose: Nose normal.  Eyes: Conjunctivae and EOM are normal. Pupils are equal, round, and reactive to light.  Neck: Normal range of motion. Neck supple. No thyromegaly present.  Cardiovascular: Normal rate, regular rhythm, normal heart sounds and intact distal pulses.   Pulmonary/Chest: Effort normal and breath sounds normal.    Abdominal: Soft. Bowel sounds are normal. There is no splenomegaly or hepatomegaly.  Genitourinary: Penis normal.  Genitourinary Comments: refusing  Musculoskeletal: Normal range of motion.  Lymphadenopathy:    He has no cervical adenopathy.  Neurological: He is alert and oriented to person, place, and time. He has normal reflexes.  Skin: Skin is warm and dry.  Psychiatric: He has a normal mood and affect. His behavior is normal. Judgment and thought content normal.    Results for orders placed or performed in visit on 08/31/16  Basic metabolic panel  Result Value Ref Range   Glucose 87 65 - 99 mg/dL   BUN 13 6 - 24 mg/dL   Creatinine, Ser 4.090.94 0.76 - 1.27 mg/dL   GFR calc non Af Amer 95 >59 mL/min/1.73   GFR calc Af Amer 110 >59 mL/min/1.73   BUN/Creatinine Ratio 14 9 - 20   Sodium 141 134 - 144 mmol/L   Potassium 4.5 3.5 - 5.2 mmol/L   Chloride 100 96 - 106 mmol/L   CO2 27 18 - 29 mmol/L   Calcium 9.7 8.7 - 10.2 mg/dL  LP+ALT+AST Piccolo, Waived  Result Value Ref Range   ALT (SGPT) Piccolo, Waived 41 10 - 47 U/L   AST (SGOT) Piccolo, Waived 38 11 - 38 U/L   Cholesterol Piccolo, Waived 146 <200 mg/dL   HDL Chol Piccolo, Waived 57 (L) >59 mg/dL   Triglycerides Piccolo,Waived  117 <150 mg/dL   Chol/HDL Ratio Piccolo,Waive 2.6 mg/dL   LDL Chol Calc Piccolo Waived 66 <100 mg/dL   VLDL Chol Calc Piccolo,Waive 23 <30 mg/dL      Assessment & Plan:   Problem List Items Addressed This Visit      Cardiovascular and Mediastinum   Hypertension    The current medical regimen is effective;  continue present plan and medications.       Relevant Medications   atorvastatin (LIPITOR) 40 MG tablet   benazepril (LOTENSIN) 20 MG tablet   Other Relevant Orders   Lipid panel   Comprehensive metabolic panel     Other   Hyperlipidemia    The current medical regimen is effective;  continue present plan and medications.       Relevant Medications   atorvastatin (LIPITOR) 40 MG  tablet   benazepril (LOTENSIN) 20 MG tablet   Other Relevant Orders   Lipid panel   Comprehensive metabolic panel   Depression    The current medical regimen is effective;  continue present plan and medications.       Relevant Medications   buPROPion (WELLBUTRIN SR) 150 MG 12 hr tablet    Other Visit Diagnoses    Annual physical exam    -  Primary   Prostate cancer screening       Relevant Orders   PSA   Thyroid disorder screen       Relevant Orders   TSH   Screening for diabetes mellitus (DM)       Relevant Orders   CBC with Differential/Platelet   Urinalysis, Routine w reflex microscopic       Follow up plan: Return in about 6 months (around 10/17/2017) for BMP,  Lipids, ALT, AST.

## 2017-04-18 ENCOUNTER — Encounter: Payer: Self-pay | Admitting: Family Medicine

## 2017-04-18 LAB — CBC WITH DIFFERENTIAL/PLATELET
BASOS ABS: 0 10*3/uL (ref 0.0–0.2)
Basos: 0 %
EOS (ABSOLUTE): 0 10*3/uL (ref 0.0–0.4)
Eos: 1 %
Hematocrit: 46.6 % (ref 37.5–51.0)
Hemoglobin: 16.3 g/dL (ref 13.0–17.7)
IMMATURE GRANS (ABS): 0 10*3/uL (ref 0.0–0.1)
IMMATURE GRANULOCYTES: 0 %
LYMPHS: 22 %
Lymphocytes Absolute: 1.4 10*3/uL (ref 0.7–3.1)
MCH: 32.2 pg (ref 26.6–33.0)
MCHC: 35 g/dL (ref 31.5–35.7)
MCV: 92 fL (ref 79–97)
Monocytes Absolute: 0.6 10*3/uL (ref 0.1–0.9)
Monocytes: 9 %
NEUTROS PCT: 68 %
Neutrophils Absolute: 4.4 10*3/uL (ref 1.4–7.0)
PLATELETS: 185 10*3/uL (ref 150–379)
RBC: 5.06 x10E6/uL (ref 4.14–5.80)
RDW: 15 % (ref 12.3–15.4)
WBC: 6.5 10*3/uL (ref 3.4–10.8)

## 2017-04-18 LAB — COMPREHENSIVE METABOLIC PANEL
ALBUMIN: 4.6 g/dL (ref 3.5–5.5)
ALK PHOS: 76 IU/L (ref 39–117)
ALT: 38 IU/L (ref 0–44)
AST: 26 IU/L (ref 0–40)
Albumin/Globulin Ratio: 1.6 (ref 1.2–2.2)
BILIRUBIN TOTAL: 0.6 mg/dL (ref 0.0–1.2)
BUN / CREAT RATIO: 12 (ref 9–20)
BUN: 11 mg/dL (ref 6–24)
CO2: 22 mmol/L (ref 20–29)
CREATININE: 0.92 mg/dL (ref 0.76–1.27)
Calcium: 9.1 mg/dL (ref 8.7–10.2)
Chloride: 99 mmol/L (ref 96–106)
GFR calc non Af Amer: 98 mL/min/{1.73_m2} (ref >59)
GFR, EST AFRICAN AMERICAN: 113 mL/min/{1.73_m2} (ref >59)
GLOBULIN, TOTAL: 2.8 g/dL (ref 1.5–4.5)
GLUCOSE: 92 mg/dL (ref 65–99)
Potassium: 4.2 mmol/L (ref 3.5–5.2)
SODIUM: 136 mmol/L (ref 134–144)
TOTAL PROTEIN: 7.4 g/dL (ref 6.0–8.5)

## 2017-04-18 LAB — LIPID PANEL
Chol/HDL Ratio: 2.7 ratio (ref 0.0–5.0)
Cholesterol, Total: 204 mg/dL — ABNORMAL HIGH (ref 100–199)
HDL: 76 mg/dL (ref >39)
LDL Calculated: 105 mg/dL — ABNORMAL HIGH (ref 0–99)
Triglycerides: 117 mg/dL (ref 0–149)
VLDL CHOLESTEROL CAL: 23 mg/dL (ref 5–40)

## 2017-04-18 LAB — TSH: TSH: 1.92 u[IU]/mL (ref 0.450–4.500)

## 2017-04-18 LAB — PSA: PROSTATE SPECIFIC AG, SERUM: 1 ng/mL (ref 0.0–4.0)

## 2017-06-01 ENCOUNTER — Telehealth: Payer: Self-pay | Admitting: Family Medicine

## 2017-06-01 NOTE — Telephone Encounter (Signed)
Called and spoke to patient. After reviewing his chart he was on rosuvastatin 10 mg when he was first started on medication this was d/c due to cost of medication, and was switched to atorvastatin 40 mg. Patient was asking if the higher dose could be causing muscle pain. Pt states he has had muscle pain in his shoulders and some in his legs for awhile now (that he cannot associate with any cause), did not at first link the two but is now wondering if this is the cause of his pain. Pt states he's also been experiencing some blurred vision, but had been associated to old age.   PT aware Dr. Dossie Arbourrissman will not be back to Monday. Okay to wait for his return.

## 2017-06-01 NOTE — Telephone Encounter (Signed)
Pt called and would like a call back regarding his atorvastatin. He is concerned about the dosage, he thought it was 10mg  instead of 40mg  and would like to know when it changed.

## 2017-06-04 NOTE — Telephone Encounter (Signed)
Phone call Discussed with patient atorvastatin patient's been on same dose for over a year. We will try stopping for 2 weeks then restarted for 2 weeks observing symptoms and reassess.

## 2017-06-04 NOTE — Telephone Encounter (Signed)
Patient was transferred to provider for telephone conversation.   

## 2017-06-04 NOTE — Telephone Encounter (Signed)
Call pt 

## 2017-06-04 NOTE — Telephone Encounter (Signed)
Left message on machine for pt to return call to the office.  

## 2017-10-11 ENCOUNTER — Ambulatory Visit: Payer: BC Managed Care – PPO | Admitting: Family Medicine

## 2017-10-15 ENCOUNTER — Ambulatory Visit: Payer: BC Managed Care – PPO | Admitting: Family Medicine

## 2017-11-12 ENCOUNTER — Ambulatory Visit: Payer: BC Managed Care – PPO | Admitting: Family Medicine

## 2017-11-12 ENCOUNTER — Encounter: Payer: Self-pay | Admitting: Family Medicine

## 2017-11-12 VITALS — BP 139/89 | HR 80 | Ht 74.0 in | Wt 221.0 lb

## 2017-11-12 DIAGNOSIS — F331 Major depressive disorder, recurrent, moderate: Secondary | ICD-10-CM

## 2017-11-12 DIAGNOSIS — I10 Essential (primary) hypertension: Secondary | ICD-10-CM

## 2017-11-12 DIAGNOSIS — E7800 Pure hypercholesterolemia, unspecified: Secondary | ICD-10-CM | POA: Diagnosis not present

## 2017-11-12 LAB — LP+ALT+AST PICCOLO, WAIVED
ALT (SGPT) Piccolo, Waived: 98 U/L — ABNORMAL HIGH (ref 10–47)
AST (SGOT) PICCOLO, WAIVED: 67 U/L — AB (ref 11–38)
CHOL/HDL RATIO PICCOLO,WAIVE: 3.2 mg/dL
CHOLESTEROL PICCOLO, WAIVED: 181 mg/dL (ref <200)
HDL CHOL PICCOLO, WAIVED: 57 mg/dL — AB (ref >59)
LDL CHOL CALC PICCOLO WAIVED: 45 mg/dL (ref <100)
TRIGLYCERIDES PICCOLO,WAIVED: 400 mg/dL — AB (ref <150)
VLDL Chol Calc Piccolo,Waive: 80 mg/dL — ABNORMAL HIGH (ref <30)

## 2017-11-12 MED ORDER — BUPROPION HCL ER (SR) 150 MG PO TB12: 150.0000 mg | ORAL_TABLET | Freq: Two times a day (BID) | ORAL | 4 refills | Status: DC

## 2017-11-12 MED ORDER — BENAZEPRIL HCL 20 MG PO TABS: 20.0000 mg | ORAL_TABLET | Freq: Every day | ORAL | 4 refills | Status: DC

## 2017-11-12 MED ORDER — ATORVASTATIN CALCIUM 40 MG PO TABS: 40.0000 mg | ORAL_TABLET | Freq: Every day | ORAL | 4 refills | Status: DC

## 2017-11-12 NOTE — Assessment & Plan Note (Signed)
Discussed blood pressure borderline control and to observe patient will check blood pressure 1-2 times a week and if elevated go ahead increase benazepril to 40 mg and follow-up here in the office afterwards.

## 2017-11-12 NOTE — Progress Notes (Signed)
BP 139/89 (BP Location: Left Arm)   Pulse 80   Ht 6\' 2"  (1.88 m)   Wt 221 lb (100.2 kg)   BMI 28.37 kg/m    Subjective:    Patient ID: Cameron Nelson, male    DOB: May 04, 1966, 52 y.o.   MRN: [id number]  HPI: Cameron Nelson is a 52 y.o. male  Chief Complaint  Patient presents with  . Follow-up  . Hyperlipidemia  Patient follow-up blood pressure doing well takes medications faithfully without problems. Cholesterol also doing well along with bupropion.  No issues or concerns from medications.  Relevant past medical, surgical, family and social history reviewed and updated as indicated. Interim medical history since our last visit reviewed. Allergies and medications reviewed and updated.  Review of Systems  Constitutional: Negative.   Respiratory: Negative.   Cardiovascular: Negative.     Per HPI unless specifically indicated above     Objective:    BP 139/89 (BP Location: Left Arm)   Pulse 80   Ht 6\' 2"  (1.88 m)   Wt 221 lb (100.2 kg)   BMI 28.37 kg/m   Wt Readings from Last 3 Encounters:  11/12/17 221 lb (100.2 kg)  04/17/17 212 lb (96.2 kg)  10/12/16 205 lb 6.4 oz (93.2 kg)    Physical Exam  Constitutional: He is oriented to person, place, and time. He appears well-developed and well-nourished.  HENT:  Head: Normocephalic and atraumatic.  Eyes: Conjunctivae and EOM are normal.  Neck: Normal range of motion.  Cardiovascular: Normal rate, regular rhythm and normal heart sounds.  Pulmonary/Chest: Effort normal and breath sounds normal.  Musculoskeletal: Normal range of motion.  Neurological: He is alert and oriented to person, place, and time.  Skin: No erythema.  Psychiatric: He has a normal mood and affect. His behavior is normal. Judgment and thought content normal.    Results for orders placed or performed in visit on 04/17/17  Microscopic Examination  Result Value Ref Range   WBC, UA None seen 0 - 5 /hpf   RBC, UA 0-2 0 - 2 /hpf   Epithelial Cells  (non renal) 0-10 0 - 10 /hpf  CBC with Differential/Platelet  Result Value Ref Range   WBC 6.5 3.4 - 10.8 x10E3/uL   RBC 5.06 4.14 - 5.80 x10E6/uL   Hemoglobin 16.3 13.0 - 17.7 g/dL   Hematocrit 78.2 95.6 - 51.0 %   MCV 92 79 - 97 fL   MCH 32.2 26.6 - 33.0 pg   MCHC 35.0 31.5 - 35.7 g/dL   RDW 21.3 08.6 - 57.8 %   Platelets 185 150 - 379 x10E3/uL   Neutrophils 68 Not Estab. %   Lymphs 22 Not Estab. %   Monocytes 9 Not Estab. %   Eos 1 Not Estab. %   Basos 0 Not Estab. %   Neutrophils Absolute 4.4 1.4 - 7.0 x10E3/uL   Lymphocytes Absolute 1.4 0.7 - 3.1 x10E3/uL   Monocytes Absolute 0.6 0.1 - 0.9 x10E3/uL   EOS (ABSOLUTE) 0.0 0.0 - 0.4 x10E3/uL   Basophils Absolute 0.0 0.0 - 0.2 x10E3/uL   Immature Granulocytes 0 Not Estab. %   Immature Grans (Abs) 0.0 0.0 - 0.1 x10E3/uL  Lipid panel  Result Value Ref Range   Cholesterol, Total 204 (H) 100 - 199 mg/dL   Triglycerides 469 0 - 149 mg/dL   HDL 76 >62 mg/dL   VLDL Cholesterol Cal 23 5 - 40 mg/dL   LDL Calculated 952 (H) 0 - 99 mg/dL  Chol/HDL Ratio 2.7 0.0 - 5.0 ratio  Comprehensive metabolic panel  Result Value Ref Range   Glucose 92 65 - 99 mg/dL   BUN 11 6 - 24 mg/dL   Creatinine, Ser 1.610.92 0.76 - 1.27 mg/dL   GFR calc non Af Amer 98 >59 mL/min/1.73   GFR calc Af Amer 113 >59 mL/min/1.73   BUN/Creatinine Ratio 12 9 - 20   Sodium 136 134 - 144 mmol/L   Potassium 4.2 3.5 - 5.2 mmol/L   Chloride 99 96 - 106 mmol/L   CO2 22 20 - 29 mmol/L   Calcium 9.1 8.7 - 10.2 mg/dL   Total Protein 7.4 6.0 - 8.5 g/dL   Albumin 4.6 3.5 - 5.5 g/dL   Globulin, Total 2.8 1.5 - 4.5 g/dL   Albumin/Globulin Ratio 1.6 1.2 - 2.2   Bilirubin Total 0.6 0.0 - 1.2 mg/dL   Alkaline Phosphatase 76 39 - 117 IU/L   AST 26 0 - 40 IU/L   ALT 38 0 - 44 IU/L  PSA  Result Value Ref Range   Prostate Specific Ag, Serum 1.0 0.0 - 4.0 ng/mL  TSH  Result Value Ref Range   TSH 1.920 0.450 - 4.500 uIU/mL  Urinalysis, Routine w reflex microscopic  Result  Value Ref Range   Specific Gravity, UA 1.010 1.005 - 1.030   pH, UA 6.0 5.0 - 7.5   Color, UA Yellow Yellow   Appearance Ur Clear Clear   Leukocytes, UA Negative Negative   Protein, UA Negative Negative/Trace   Glucose, UA Negative Negative   Ketones, UA Negative Negative   RBC, UA Trace (A) Negative   Bilirubin, UA Negative Negative   Urobilinogen, Ur 0.2 0.2 - 1.0 mg/dL   Nitrite, UA Negative Negative   Microscopic Examination See below:       Assessment & Plan:   Problem List Items Addressed This Visit      Cardiovascular and Mediastinum   Hypertension - Primary    Discussed blood pressure borderline control and to observe patient will check blood pressure 1-2 times a week and if elevated go ahead increase benazepril to 40 mg and follow-up here in the office afterwards.      Relevant Medications   atorvastatin (LIPITOR) 40 MG tablet   benazepril (LOTENSIN) 20 MG tablet   Other Relevant Orders   Basic metabolic panel   LP+ALT+AST Piccolo, Waived     Other   Hyperlipidemia    The current medical regimen is effective;  continue present plan and medications.       Relevant Medications   atorvastatin (LIPITOR) 40 MG tablet   benazepril (LOTENSIN) 20 MG tablet   Other Relevant Orders   Basic metabolic panel   LP+ALT+AST Piccolo, Waived   Depression    The current medical regimen is effective;  continue present plan and medications.       Relevant Medications   buPROPion (WELLBUTRIN SR) 150 MG 12 hr tablet       Follow up plan: Return in about 6 months (around 05/12/2018).

## 2017-11-12 NOTE — Assessment & Plan Note (Signed)
The current medical regimen is effective;  continue present plan and medications.  

## 2017-11-13 ENCOUNTER — Encounter: Payer: Self-pay | Admitting: Family Medicine

## 2017-11-13 LAB — BASIC METABOLIC PANEL
BUN / CREAT RATIO: 9 (ref 9–20)
BUN: 9 mg/dL (ref 6–24)
CO2: 23 mmol/L (ref 20–29)
Calcium: 9.5 mg/dL (ref 8.7–10.2)
Chloride: 102 mmol/L (ref 96–106)
Creatinine, Ser: 0.97 mg/dL (ref 0.76–1.27)
GFR calc non Af Amer: 91 mL/min/{1.73_m2} (ref >59)
GFR, EST AFRICAN AMERICAN: 106 mL/min/{1.73_m2} (ref >59)
Glucose: 107 mg/dL — ABNORMAL HIGH (ref 65–99)
POTASSIUM: 4.4 mmol/L (ref 3.5–5.2)
SODIUM: 141 mmol/L (ref 134–144)

## 2018-05-13 ENCOUNTER — Ambulatory Visit (INDEPENDENT_AMBULATORY_CARE_PROVIDER_SITE_OTHER): Payer: BC Managed Care – PPO | Admitting: Family Medicine

## 2018-05-13 ENCOUNTER — Encounter: Payer: Self-pay | Admitting: Family Medicine

## 2018-05-13 VITALS — BP 124/84 | HR 65 | Ht 73.25 in | Wt 213.0 lb

## 2018-05-13 DIAGNOSIS — Z0000 Encounter for general adult medical examination without abnormal findings: Secondary | ICD-10-CM

## 2018-05-13 DIAGNOSIS — I10 Essential (primary) hypertension: Secondary | ICD-10-CM

## 2018-05-13 DIAGNOSIS — F331 Major depressive disorder, recurrent, moderate: Secondary | ICD-10-CM | POA: Diagnosis not present

## 2018-05-13 DIAGNOSIS — Z0001 Encounter for general adult medical examination with abnormal findings: Secondary | ICD-10-CM

## 2018-05-13 DIAGNOSIS — Z114 Encounter for screening for human immunodeficiency virus [HIV]: Secondary | ICD-10-CM

## 2018-05-13 DIAGNOSIS — E7800 Pure hypercholesterolemia, unspecified: Secondary | ICD-10-CM

## 2018-05-13 DIAGNOSIS — Z125 Encounter for screening for malignant neoplasm of prostate: Secondary | ICD-10-CM

## 2018-05-13 DIAGNOSIS — Z1329 Encounter for screening for other suspected endocrine disorder: Secondary | ICD-10-CM

## 2018-05-13 LAB — URINALYSIS, ROUTINE W REFLEX MICROSCOPIC
BILIRUBIN UA: NEGATIVE
GLUCOSE, UA: NEGATIVE
Ketones, UA: NEGATIVE
Leukocytes, UA: NEGATIVE
Nitrite, UA: NEGATIVE
PH UA: 5.5 (ref 5.0–7.5)
RBC, UA: NEGATIVE
Specific Gravity, UA: 1.025 (ref 1.005–1.030)
UUROB: 0.2 mg/dL (ref 0.2–1.0)

## 2018-05-13 MED ORDER — BENAZEPRIL HCL 10 MG PO TABS: 10.0000 mg | ORAL_TABLET | Freq: Every day | ORAL | 4 refills | Status: DC

## 2018-05-13 MED ORDER — ATORVASTATIN CALCIUM 40 MG PO TABS: 40.0000 mg | ORAL_TABLET | Freq: Every day | ORAL | 4 refills | Status: DC

## 2018-05-13 MED ORDER — BUPROPION HCL ER (SR) 150 MG PO TB12: 150.0000 mg | ORAL_TABLET | Freq: Two times a day (BID) | ORAL | 4 refills | Status: DC

## 2018-05-13 NOTE — Assessment & Plan Note (Signed)
The current medical regimen is effective;  continue present plan and medications.  

## 2018-05-13 NOTE — Progress Notes (Signed)
BP 124/84   Pulse 65   Ht 6' 1.25" (1.861 m)   Wt 213 lb (96.6 kg)   SpO2 98%   BMI 27.91 kg/m    Subjective:    Patient ID: Cameron Nelson, male    DOB: 09/06/1966, 52 y.o.   MRN: [id number]  HPI: Danyl Deems is a 52 y.o. male  Chief Complaint  Patient presents with  . Annual Exam   Patient all in all doing well with no complaints taking bupropion has been able to cut back 150 each day and doing well with his nerves. Taking atorvastatin 40 without any problems. Taking Benzapril 20 mg but has changed his lifestyle working out better eating better has lost some weight and is noticed blood pressure is consistently low and at times is been very lightheaded and dizzy especially with workouts and in the heat.                                 Relevant past medical, surgical, family and social history reviewed and updated as indicated. Interim medical history since our last visit reviewed. Allergies and medications reviewed and updated.  Review of Systems  Constitutional: Negative.   HENT: Negative.   Eyes: Negative.   Respiratory: Negative.   Cardiovascular: Negative.   Gastrointestinal: Negative.   Endocrine: Negative.   Genitourinary: Negative.   Musculoskeletal: Negative.   Skin: Negative.   Allergic/Immunologic: Negative.   Neurological: Negative.   Hematological: Negative.   Psychiatric/Behavioral: Negative.     Per HPI unless specifically indicated above     Objective:    BP 124/84   Pulse 65   Ht 6' 1.25" (1.861 m)   Wt 213 lb (96.6 kg)   SpO2 98%   BMI 27.91 kg/m   Wt Readings from Last 3 Encounters:  05/13/18 213 lb (96.6 kg)  11/12/17 221 lb (100.2 kg)  04/17/17 212 lb (96.2 kg)    Physical Exam  Constitutional: He is oriented to person, place, and time. He appears well-developed and well-nourished.  HENT:  Head: Normocephalic and atraumatic.  Right Ear: External ear normal.  Left Ear: External ear normal.  Eyes: Pupils are equal, round, and  reactive to light. Conjunctivae and EOM are normal.  Neck: Normal range of motion. Neck supple.  Cardiovascular: Normal rate, regular rhythm, normal heart sounds and intact distal pulses.  Pulmonary/Chest: Effort normal and breath sounds normal.  Abdominal: Soft. Bowel sounds are normal. There is no splenomegaly or hepatomegaly.  Genitourinary: Rectum normal, prostate normal and penis normal.  Musculoskeletal: Normal range of motion.  Neurological: He is alert and oriented to person, place, and time. He has normal reflexes.  Skin: No rash noted. No erythema.  Psychiatric: He has a normal mood and affect. His behavior is normal. Judgment and thought content normal.    Results for orders placed or performed in visit on 11/12/17  Basic metabolic panel  Result Value Ref Range   Glucose 107 (H) 65 - 99 mg/dL   BUN 9 6 - 24 mg/dL   Creatinine, Ser 8.29 0.76 - 1.27 mg/dL   GFR calc non Af Amer 91 >59 mL/min/1.73   GFR calc Af Amer 106 >59 mL/min/1.73   BUN/Creatinine Ratio 9 9 - 20   Sodium 141 134 - 144 mmol/L   Potassium 4.4 3.5 - 5.2 mmol/L   Chloride 102 96 - 106 mmol/L   CO2 23 20 - 29 mmol/L  Calcium 9.5 8.7 - 10.2 mg/dL  LP+ALT+AST Piccolo, Waived  Result Value Ref Range   ALT (SGPT) Piccolo, Waived 98 (H) 10 - 47 U/L   AST (SGOT) Piccolo, Waived 67 (H) 11 - 38 U/L   Cholesterol Piccolo, Waived 181 <200 mg/dL   HDL Chol Piccolo, Waived 57 (L) >59 mg/dL   Triglycerides Piccolo,Waived 400 (H) <150 mg/dL   Chol/HDL Ratio Piccolo,Waive 3.2 mg/dL   LDL Chol Calc Piccolo Waived 45 <100 mg/dL   VLDL Chol Calc Piccolo,Waive 80 (H) <30 mg/dL      Assessment & Plan:   Problem List Items Addressed This Visit      Cardiovascular and Mediastinum   Hypertension - Primary    The current medical regimen is effective;  continue present plan and medications.       Relevant Medications   benazepril (LOTENSIN) 10 MG tablet   atorvastatin (LIPITOR) 40 MG tablet   Other Relevant Orders     CBC with Differential/Platelet   Comprehensive metabolic panel   Lipid panel   Urinalysis, Routine w reflex microscopic     Other   Hyperlipidemia    The current medical regimen is effective;  continue present plan and medications.       Relevant Medications   benazepril (LOTENSIN) 10 MG tablet   atorvastatin (LIPITOR) 40 MG tablet   Other Relevant Orders   CBC with Differential/Platelet   Comprehensive metabolic panel   Lipid panel   Urinalysis, Routine w reflex microscopic   Depression    The current medical regimen is effective;  continue present plan and medications.       Relevant Medications   buPROPion (WELLBUTRIN SR) 150 MG 12 hr tablet    Other Visit Diagnoses    Prostate cancer screening       Relevant Orders   PSA   Thyroid disorder screen       Relevant Orders   TSH   Encounter for screening for HIV       Relevant Orders   HIV antibody (with reflex)   PE (physical exam), annual           Follow up plan: Return in about 6 months (around 11/13/2018) for BMP,  Lipids, ALT, AST.

## 2018-05-14 ENCOUNTER — Telehealth: Payer: Self-pay | Admitting: Family Medicine

## 2018-05-14 ENCOUNTER — Encounter: Payer: Self-pay | Admitting: Family Medicine

## 2018-05-14 DIAGNOSIS — R972 Elevated prostate specific antigen [PSA]: Secondary | ICD-10-CM

## 2018-05-14 LAB — CBC WITH DIFFERENTIAL/PLATELET
BASOS ABS: 0 10*3/uL (ref 0.0–0.2)
Basos: 0 %
EOS (ABSOLUTE): 0.1 10*3/uL (ref 0.0–0.4)
Eos: 2 %
HEMATOCRIT: 39.7 % (ref 37.5–51.0)
Hemoglobin: 13.8 g/dL (ref 13.0–17.7)
Immature Grans (Abs): 0 10*3/uL (ref 0.0–0.1)
Immature Granulocytes: 0 %
LYMPHS ABS: 1.5 10*3/uL (ref 0.7–3.1)
LYMPHS: 33 %
MCH: 32.3 pg (ref 26.6–33.0)
MCHC: 34.8 g/dL (ref 31.5–35.7)
MCV: 93 fL (ref 79–97)
MONOCYTES: 11 %
Monocytes Absolute: 0.5 10*3/uL (ref 0.1–0.9)
Neutrophils Absolute: 2.4 10*3/uL (ref 1.4–7.0)
Neutrophils: 54 %
Platelets: 262 10*3/uL (ref 150–450)
RBC: 4.27 x10E6/uL (ref 4.14–5.80)
RDW: 15.1 % (ref 12.3–15.4)
WBC: 4.5 10*3/uL (ref 3.4–10.8)

## 2018-05-14 LAB — COMPREHENSIVE METABOLIC PANEL
ALK PHOS: 68 IU/L (ref 39–117)
ALT: 35 IU/L (ref 0–44)
AST: 30 IU/L (ref 0–40)
Albumin/Globulin Ratio: 1.9 (ref 1.2–2.2)
Albumin: 4.3 g/dL (ref 3.5–5.5)
BUN/Creatinine Ratio: 14 (ref 9–20)
BUN: 14 mg/dL (ref 6–24)
Bilirubin Total: 0.3 mg/dL (ref 0.0–1.2)
CO2: 20 mmol/L (ref 20–29)
CREATININE: 1.01 mg/dL (ref 0.76–1.27)
Calcium: 9 mg/dL (ref 8.7–10.2)
Chloride: 102 mmol/L (ref 96–106)
GFR calc Af Amer: 100 mL/min/{1.73_m2} (ref >59)
GFR calc non Af Amer: 87 mL/min/{1.73_m2} (ref >59)
GLOBULIN, TOTAL: 2.3 g/dL (ref 1.5–4.5)
GLUCOSE: 90 mg/dL (ref 65–99)
Potassium: 4.5 mmol/L (ref 3.5–5.2)
SODIUM: 139 mmol/L (ref 134–144)
Total Protein: 6.6 g/dL (ref 6.0–8.5)

## 2018-05-14 LAB — PSA: Prostate Specific Ag, Serum: 2.9 ng/mL (ref 0.0–4.0)

## 2018-05-14 LAB — LIPID PANEL
Chol/HDL Ratio: 3.5 ratio (ref 0.0–5.0)
Cholesterol, Total: 168 mg/dL (ref 100–199)
HDL: 48 mg/dL (ref >39)
LDL CALC: 95 mg/dL (ref 0–99)
TRIGLYCERIDES: 123 mg/dL (ref 0–149)
VLDL CHOLESTEROL CAL: 25 mg/dL (ref 5–40)

## 2018-05-14 LAB — TSH: TSH: 3.32 u[IU]/mL (ref 0.450–4.500)

## 2018-05-14 LAB — HIV ANTIBODY (ROUTINE TESTING W REFLEX): HIV SCREEN 4TH GENERATION: NONREACTIVE

## 2018-05-14 NOTE — Telephone Encounter (Signed)
-----   Message from Richarda OverlieJada A Fox, [place] sent at 05/14/2018  1:12 PM EDT ----- Patient was transferred to provider for telephone conversation.

## 2018-05-14 NOTE — Telephone Encounter (Signed)
Phone call Discussed with patient normal PSA but faster rate of change than expected from last year to this we will recheck PSA in 3 months or so.

## 2018-11-13 ENCOUNTER — Encounter: Payer: Self-pay | Admitting: Family Medicine

## 2018-11-13 ENCOUNTER — Telehealth: Payer: Self-pay | Admitting: Family Medicine

## 2018-11-13 ENCOUNTER — Ambulatory Visit: Payer: BC Managed Care – PPO | Admitting: Family Medicine

## 2018-11-13 VITALS — BP 126/75 | HR 73 | Temp 98.0°F | Ht 74.0 in | Wt 218.5 lb

## 2018-11-13 DIAGNOSIS — E7800 Pure hypercholesterolemia, unspecified: Secondary | ICD-10-CM

## 2018-11-13 DIAGNOSIS — Z1212 Encounter for screening for malignant neoplasm of rectum: Secondary | ICD-10-CM

## 2018-11-13 DIAGNOSIS — F331 Major depressive disorder, recurrent, moderate: Secondary | ICD-10-CM

## 2018-11-13 DIAGNOSIS — I10 Essential (primary) hypertension: Secondary | ICD-10-CM | POA: Diagnosis not present

## 2018-11-13 DIAGNOSIS — M7551 Bursitis of right shoulder: Secondary | ICD-10-CM | POA: Insufficient documentation

## 2018-11-13 DIAGNOSIS — R972 Elevated prostate specific antigen [PSA]: Secondary | ICD-10-CM

## 2018-11-13 DIAGNOSIS — Z1211 Encounter for screening for malignant neoplasm of colon: Secondary | ICD-10-CM | POA: Diagnosis not present

## 2018-11-13 HISTORY — DX: Bursitis of right shoulder: M75.51

## 2018-11-13 LAB — LP+ALT+AST PICCOLO, WAIVED
ALT (SGPT) Piccolo, Waived: 40 U/L (ref 10–47)
AST (SGOT) PICCOLO, WAIVED: 37 U/L (ref 11–38)
CHOLESTEROL PICCOLO, WAIVED: 175 mg/dL (ref <200)
Chol/HDL Ratio Piccolo,Waive: 2.8 mg/dL
HDL CHOL PICCOLO, WAIVED: 63 mg/dL (ref >59)
LDL CHOL CALC PICCOLO WAIVED: 34 mg/dL (ref <100)
Triglycerides Piccolo,Waived: 391 mg/dL — ABNORMAL HIGH (ref <150)
VLDL Chol Calc Piccolo,Waive: 78 mg/dL — ABNORMAL HIGH (ref <30)

## 2018-11-13 MED ORDER — MELOXICAM 15 MG PO TABS: 15.0000 mg | ORAL_TABLET | Freq: Every day | ORAL | 3 refills | Status: DC

## 2018-11-13 NOTE — Assessment & Plan Note (Signed)
The current medical regimen is effective;  continue present plan and medications.  

## 2018-11-13 NOTE — Telephone Encounter (Signed)
Patient wanted to let you know it was his grandfather that passed away at age 51 with prostate cancer. (Maternal)  Just FYI

## 2018-11-13 NOTE — Assessment & Plan Note (Signed)
Discussed shoulder bursitis care and treatment will continue PT meloxicam

## 2018-11-13 NOTE — Progress Notes (Signed)
BP 126/75 (BP Location: Left Arm, Patient Position: Sitting, Cuff Size: Normal)   Pulse 73   Temp 98 F (36.7 C) (Oral)   Ht 6\' 2"  (1.88 m)   Wt 218 lb 8 oz (99.1 kg)   SpO2 98%   BMI 28.05 kg/m    Subjective:    Patient ID: Cameron Nelson, male    DOB: 03/21/1966, 53 y.o.   MRN: [id number]  HPI: Cameron Nelson is a 53 y.o. male  Chief Complaint  Patient presents with  . Hyperlipidemia  . Hypertension  . Elevated PSA  . Shoulder Pain    Right Shoulder   Patient all in all doing well no complaints taking atorvastatin without problems or issues and good control. Blood pressure doing well with Benzapril was able to cut back during the summer when he is out of school but now back in school is back on 10 mg with good control and no side effects. Takes bupropion without problems for nerves which helps control. PSA some added concern with development of realizing grandfather and 1 uncle had prostate cancer.  This was added to his history. Checking PSA today. Has right shoulder pain discomfort sometimes radiates into his right little finger but no real neck pain nothing really bothers him especially at night about his shoulder.  Is working with physical therapy is been 4 times.  Has had some injections in 1 of the other shoulders to previous time sometime back over the years. Has taken meloxicam for his hip which seem to help.  Relevant past medical, surgical, family and social history reviewed and updated as indicated. Interim medical history since our last visit reviewed. Allergies and medications reviewed and updated.  Review of Systems  Constitutional: Negative.   Respiratory: Negative.   Cardiovascular: Negative.     Per HPI unless specifically indicated above     Objective:    BP 126/75 (BP Location: Left Arm, Patient Position: Sitting, Cuff Size: Normal)   Pulse 73   Temp 98 F (36.7 C) (Oral)   Ht 6\' 2"  (1.88 m)   Wt 218 lb 8 oz (99.1 kg)   SpO2 98%   BMI 28.05  kg/m   Wt Readings from Last 3 Encounters:  11/13/18 218 lb 8 oz (99.1 kg)  05/13/18 213 lb (96.6 kg)  11/12/17 221 lb (100.2 kg)    Physical Exam Constitutional:      Appearance: He is well-developed.  HENT:     Head: Normocephalic and atraumatic.  Eyes:     Conjunctiva/sclera: Conjunctivae normal.  Neck:     Musculoskeletal: Normal range of motion.  Cardiovascular:     Rate and Rhythm: Normal rate and regular rhythm.     Heart sounds: Normal heart sounds.  Pulmonary:     Effort: Pulmonary effort is normal.     Breath sounds: Normal breath sounds.  Musculoskeletal: Normal range of motion.  Skin:    Findings: No erythema.  Neurological:     Mental Status: He is alert and oriented to person, place, and time.  Psychiatric:        Behavior: Behavior normal.        Thought Content: Thought content normal.        Judgment: Judgment normal.     Results for orders placed or performed in visit on 11/13/18  LP+ALT+AST Piccolo, Waived  Result Value Ref Range   ALT (SGPT) Piccolo, Waived 40 10 - 47 U/L   AST (SGOT) Piccolo, Waived 37 11 -  38 U/L   Cholesterol Piccolo, Waived 175 <200 mg/dL   HDL Chol Piccolo, Waived 63 >59 mg/dL   Triglycerides Piccolo,Waived 391 (H) <150 mg/dL   Chol/HDL Ratio Piccolo,Waive 2.8 mg/dL   LDL Chol Calc Piccolo Waived 34 <100 mg/dL   VLDL Chol Calc Piccolo,Waive 78 (H) <30 mg/dL      Assessment & Plan:   Problem List Items Addressed This Visit      Cardiovascular and Mediastinum   Hypertension - Primary    The current medical regimen is effective;  continue present plan and medications.       Relevant Orders   Basic metabolic panel     Musculoskeletal and Integument   Bursitis of right shoulder    Discussed shoulder bursitis care and treatment will continue PT meloxicam        Other   Hyperlipidemia    The current medical regimen is effective;  continue present plan and medications.       Relevant Orders   LP+ALT+AST  Piccolo, Waived (Completed)   Moderate episode of recurrent major depressive disorder (HCC)    The current medical regimen is effective;  continue present plan and medications.        Other Visit Diagnoses    Screening for colorectal cancer       Relevant Orders   Cologuard   Elevated PSA       Relevant Orders   PSA    PSA not elevated absolutely but increased velocity check PSA today.   Follow up plan: Return in about 6 months (around 05/14/2019) for Physical Exam.

## 2018-11-14 ENCOUNTER — Encounter: Payer: Self-pay | Admitting: Family Medicine

## 2018-11-14 LAB — BASIC METABOLIC PANEL
BUN/Creatinine Ratio: 8 — ABNORMAL LOW (ref 9–20)
BUN: 11 mg/dL (ref 6–24)
CHLORIDE: 99 mmol/L (ref 96–106)
CO2: 23 mmol/L (ref 20–29)
CREATININE: 1.37 mg/dL — AB (ref 0.76–1.27)
Calcium: 9.2 mg/dL (ref 8.7–10.2)
GFR calc Af Amer: 69 mL/min/{1.73_m2} (ref >59)
GFR calc non Af Amer: 60 mL/min/{1.73_m2} (ref >59)
GLUCOSE: 97 mg/dL (ref 65–99)
POTASSIUM: 4.3 mmol/L (ref 3.5–5.2)
SODIUM: 138 mmol/L (ref 134–144)

## 2018-11-14 LAB — PSA: Prostate Specific Ag, Serum: 0.9 ng/mL (ref 0.0–4.0)

## 2019-05-14 ENCOUNTER — Ambulatory Visit (INDEPENDENT_AMBULATORY_CARE_PROVIDER_SITE_OTHER): Payer: BC Managed Care – PPO | Admitting: Family Medicine

## 2019-05-14 ENCOUNTER — Other Ambulatory Visit: Payer: Self-pay

## 2019-05-14 ENCOUNTER — Encounter: Payer: Self-pay | Admitting: Family Medicine

## 2019-05-14 VITALS — BP 120/78

## 2019-05-14 DIAGNOSIS — F331 Major depressive disorder, recurrent, moderate: Secondary | ICD-10-CM | POA: Diagnosis not present

## 2019-05-14 DIAGNOSIS — Z0000 Encounter for general adult medical examination without abnormal findings: Secondary | ICD-10-CM | POA: Diagnosis not present

## 2019-05-14 DIAGNOSIS — I10 Essential (primary) hypertension: Secondary | ICD-10-CM

## 2019-05-14 DIAGNOSIS — E7800 Pure hypercholesterolemia, unspecified: Secondary | ICD-10-CM | POA: Diagnosis not present

## 2019-05-14 MED ORDER — ATORVASTATIN CALCIUM 40 MG PO TABS: 40.0000 mg | ORAL_TABLET | Freq: Every day | ORAL | 4 refills | Status: DC

## 2019-05-14 MED ORDER — BENAZEPRIL HCL 10 MG PO TABS: 10.0000 mg | ORAL_TABLET | Freq: Every day | ORAL | 4 refills | Status: DC

## 2019-05-14 MED ORDER — FAMOTIDINE 20 MG PO TABS: 20.0000 mg | ORAL_TABLET | Freq: Every day | ORAL | 4 refills | Status: DC

## 2019-05-14 NOTE — Assessment & Plan Note (Signed)
The current medical regimen is effective;  continue present plan and medications.  

## 2019-05-14 NOTE — Progress Notes (Addendum)
BP 120/78    Subjective:    Patient ID: Cameron ShipperClifford Nelson, male    DOB: 07/09/1966, 53 y.o.   MRN: [id number]  HPI: Cameron Nelson is a 53 y.o. male  Med check  Discussed with patient all in all doing well no complaints for hypertension has been paying attention to diet exercise nutrition and is been able to decrease benazepril to 5 mg a day and doing well wants to continue with 10 mg to cut in half. Nerves depression are also doing very well wants to stop Wellbutrin is only taking 75 mg a day and is doing well. Same for atorvastatin is taking 20 mg and doing well.   Relevant past medical, surgical, family and social history reviewed and updated as indicated. Interim medical history since our last visit reviewed. Allergies and medications reviewed and updated.  Review of Systems  Constitutional: Negative.   HENT: Negative.   Eyes: Negative.   Respiratory: Negative.   Cardiovascular: Negative.   Gastrointestinal: Negative.   Endocrine: Negative.   Genitourinary: Negative.   Musculoskeletal: Negative.   Skin: Negative.   Allergic/Immunologic: Negative.   Neurological: Negative.   Hematological: Negative.   Psychiatric/Behavioral: Negative.     Per HPI unless specifically indicated above     Objective:    BP 120/78   Wt Readings from Last 3 Encounters:  11/13/18 218 lb 8 oz (99.1 kg)  05/13/18 213 lb (96.6 kg)  11/12/17 221 lb (100.2 kg)    Physical Exam  Results for orders placed or performed in visit on 11/13/18  Basic metabolic panel  Result Value Ref Range   Glucose 97 65 - 99 mg/dL   BUN 11 6 - 24 mg/dL   Creatinine, Ser 4.091.37 (H) 0.76 - 1.27 mg/dL   GFR calc non Af Amer 60 >59 mL/min/1.73   GFR calc Af Amer 69 >59 mL/min/1.73   BUN/Creatinine Ratio 8 (L) 9 - 20   Sodium 138 134 - 144 mmol/L   Potassium 4.3 3.5 - 5.2 mmol/L   Chloride 99 96 - 106 mmol/L   CO2 23 20 - 29 mmol/L   Calcium 9.2 8.7 - 10.2 mg/dL  LP+ALT+AST Piccolo, Waived  Result Value Ref  Range   ALT (SGPT) Piccolo, Waived 40 10 - 47 U/L   AST (SGOT) Piccolo, Waived 37 11 - 38 U/L   Cholesterol Piccolo, Waived 175 <200 mg/dL   HDL Chol Piccolo, Waived 63 >59 mg/dL   Triglycerides Piccolo,Waived 391 (H) <150 mg/dL   Chol/HDL Ratio Piccolo,Waive 2.8 mg/dL   LDL Chol Calc Piccolo Waived 34 <100 mg/dL   VLDL Chol Calc Piccolo,Waive 78 (H) <30 mg/dL  PSA  Result Value Ref Range   Prostate Specific Ag, Serum 0.9 0.0 - 4.0 ng/mL      Assessment & Plan:   Problem List Items Addressed This Visit      Cardiovascular and Mediastinum   Hypertension    Good control at 5 mg with school starting back may need to increase again but will observe.      Relevant Medications   benazepril (LOTENSIN) 10 MG tablet   atorvastatin (LIPITOR) 40 MG tablet     Other   Hyperlipidemia    The current medical regimen is effective;  continue present plan and medications.        Relevant Medications   benazepril (LOTENSIN) 10 MG tablet   atorvastatin (LIPITOR) 40 MG tablet   Moderate episode of recurrent major depressive disorder (HCC)  Resolving depression will try stopping medication but school was starting back up may need again will observe       Other Visit Diagnoses    PE (physical exam), annual    -  Primary   Relevant Orders   Comprehensive metabolic panel   Lipid panel   CBC with Differential/Platelet   TSH   Urinalysis, Routine w reflex microscopic   PSA      Telemedicine using audio/video telecommunications for a synchronous communication visit. Today's visit due to COVID-19 isolation precautions I connected with and verified that I am speaking with the correct person using two identifiers.   I discussed the limitations, risks, security and privacy concerns of performing an evaluation and management service by telecommunication and the availability of in person appointments. I also discussed with the patient that there may be a patient responsible charge related to  this service. The patient expressed understanding and agreed to proceed. The patient's location is home. I am at home.   I discussed the assessment and treatment plan with the patient. The patient was provided an opportunity to ask questions and all were answered. The patient agreed with the plan and demonstrated an understanding of the instructions.   The patient was advised to call back or seek an in-person evaluation if the symptoms worsen or if the condition fails to improve as anticipated.   I provided 21+ minutes of time during this encounter. Follow up plan: Return for Physical Exam.

## 2019-05-14 NOTE — Assessment & Plan Note (Signed)
Resolving depression will try stopping medication but school was starting back up may need again will observe

## 2019-05-14 NOTE — Assessment & Plan Note (Signed)
Good control at 5 mg with school starting back may need to increase again but will observe.

## 2019-06-26 ENCOUNTER — Other Ambulatory Visit: Payer: Self-pay

## 2019-06-26 DIAGNOSIS — Z20822 Contact with and (suspected) exposure to covid-19: Secondary | ICD-10-CM

## 2019-06-27 LAB — NOVEL CORONAVIRUS, NAA: SARS-CoV-2, NAA: DETECTED — AB

## 2019-07-09 ENCOUNTER — Encounter: Payer: Self-pay | Admitting: Nurse Practitioner

## 2019-12-20 ENCOUNTER — Ambulatory Visit: Payer: BC Managed Care – PPO | Attending: Internal Medicine

## 2019-12-20 DIAGNOSIS — Z23 Encounter for immunization: Secondary | ICD-10-CM

## 2019-12-20 NOTE — Progress Notes (Signed)
   Covid-19 Vaccination Clinic  Name:  Zymiere Trostle    MRN: [id number] DOB: 12/20/1965  12/20/2019  Mr. Aikey was observed post Covid-19 immunization for 15 minutes without incidence. He was provided with Vaccine Information Sheet and instruction to access the V-Safe system.   Mr. Hausmann was instructed to call 911 with any severe reactions post vaccine: Marland Kitchen Difficulty breathing  . Swelling of your face and throat  . A fast heartbeat  . A bad rash all over your body  . Dizziness and weakness    Immunizations Administered    Name Date Dose VIS Date Route   Pfizer COVID-19 Vaccine 12/20/2019  4:02 PM 0.3 mL 10/03/2019 Intramuscular   Manufacturer: [employer], [employer]   Lot: [id number]   NDC: [address]-1

## 2020-01-10 ENCOUNTER — Ambulatory Visit: Payer: BC Managed Care – PPO | Attending: Internal Medicine

## 2020-01-10 DIAGNOSIS — Z23 Encounter for immunization: Secondary | ICD-10-CM

## 2020-01-10 NOTE — Progress Notes (Signed)
   Covid-19 Vaccination Clinic  Name:  Cameron Nelson    MRN: [id number] DOB: 1966-02-24  01/10/2020  Mr. Cameron Nelson was observed post Covid-19 immunization for 15 minutes without incident. He was provided with Vaccine Information Sheet and instruction to access the V-Safe system.   Mr. Cameron Nelson was instructed to call 911 with any severe reactions post vaccine: Marland Kitchen Difficulty breathing  . Swelling of face and throat  . A fast heartbeat  . A bad rash all over body  . Dizziness and weakness   Immunizations Administered    Name Date Dose VIS Date Route   Pfizer COVID-19 Vaccine 01/10/2020  8:38 AM 0.3 mL 10/03/2019 Intramuscular   Manufacturer: [employer], [employer]   Lot: [id number]   NDC: [address]-4

## 2020-01-22 ENCOUNTER — Encounter: Payer: Self-pay | Admitting: Nurse Practitioner

## 2020-02-16 ENCOUNTER — Encounter: Payer: Self-pay | Admitting: Nurse Practitioner

## 2020-03-16 ENCOUNTER — Encounter: Payer: Self-pay | Admitting: Nurse Practitioner

## 2020-04-13 ENCOUNTER — Encounter: Payer: Self-pay | Admitting: Nurse Practitioner

## 2020-04-27 ENCOUNTER — Encounter: Payer: Self-pay | Admitting: Nurse Practitioner

## 2020-05-07 ENCOUNTER — Encounter: Payer: Self-pay | Admitting: Nurse Practitioner

## 2020-05-21 ENCOUNTER — Encounter: Payer: Self-pay | Admitting: Nurse Practitioner

## 2020-05-31 ENCOUNTER — Ambulatory Visit: Payer: Self-pay

## 2020-05-31 NOTE — Telephone Encounter (Signed)
Pt has apt on 06/10/2020 with Wicker,Cheryl NP. Pt verbalized understanding.

## 2020-05-31 NOTE — Telephone Encounter (Signed)
Thank you :)

## 2020-05-31 NOTE — Telephone Encounter (Signed)
   CM 1  [address]" Male, 54 y.o., [birth year] MRN:  [id number] Phone:  [phone] Judie Petit) PCP:  Wells Guiles, NP Coverage:  Valinda Hoar [employer] [facility] Next Appt With Family Medicine 06/04/2020 at 9:00 AM Message from Valora Piccolo sent at 05/31/2020 11:26 AM EDT  Patient is calling to call advice for NT- Patient went [place] in [place]- a guest at the patient table tested positive for COVID. Patient is vaccinated. Patient has previously tested positive for COVID. And is not showing any symptoms. Should he still keep his CPE appt on Friday? Or should he wait and rescheduled 10 days later?   Call History   Type Contact Phone  05/31/2020 11:23 AM EDT Phone (Incoming) [name], Cameron "Hoxie Figg" (Self) [phone] (H)  User: Jens Som A  Pt. Is requesting to change his appointment for a physical from this Friday to August 19 or 20. He has these days off. States he would rather change his appointment from this Friday than get tested for COVID 19 prior to this Friday. Triage could not find any availability on these days for a physical Please advise pt.

## 2020-05-31 NOTE — Telephone Encounter (Signed)
It would be best to reschedule his appointment for 8/19 or 8/20; do other providers have availability this day for a physical?

## 2020-06-04 ENCOUNTER — Encounter: Payer: Self-pay | Admitting: Nurse Practitioner

## 2020-06-10 ENCOUNTER — Other Ambulatory Visit: Payer: Self-pay

## 2020-06-10 ENCOUNTER — Ambulatory Visit (INDEPENDENT_AMBULATORY_CARE_PROVIDER_SITE_OTHER): Payer: BC Managed Care – PPO | Admitting: Unknown Physician Specialty

## 2020-06-10 ENCOUNTER — Encounter: Payer: Self-pay | Admitting: Unknown Physician Specialty

## 2020-06-10 VITALS — BP 120/80 | HR 69 | Temp 97.7°F | Ht 77.0 in | Wt 198.0 lb

## 2020-06-10 DIAGNOSIS — Z0000 Encounter for general adult medical examination without abnormal findings: Secondary | ICD-10-CM | POA: Diagnosis not present

## 2020-06-10 DIAGNOSIS — Z1159 Encounter for screening for other viral diseases: Secondary | ICD-10-CM | POA: Diagnosis not present

## 2020-06-10 DIAGNOSIS — Z1211 Encounter for screening for malignant neoplasm of colon: Secondary | ICD-10-CM | POA: Diagnosis not present

## 2020-06-10 DIAGNOSIS — E7800 Pure hypercholesterolemia, unspecified: Secondary | ICD-10-CM | POA: Diagnosis not present

## 2020-06-10 DIAGNOSIS — R972 Elevated prostate specific antigen [PSA]: Secondary | ICD-10-CM | POA: Diagnosis not present

## 2020-06-10 DIAGNOSIS — F330 Major depressive disorder, recurrent, mild: Secondary | ICD-10-CM | POA: Diagnosis not present

## 2020-06-10 DIAGNOSIS — I10 Essential (primary) hypertension: Secondary | ICD-10-CM

## 2020-06-10 MED ORDER — BENAZEPRIL HCL 10 MG PO TABS: 10.0000 mg | ORAL_TABLET | Freq: Every day | ORAL | 4 refills | Status: DC

## 2020-06-10 MED ORDER — ATORVASTATIN CALCIUM 40 MG PO TABS: 40.0000 mg | ORAL_TABLET | Freq: Every day | ORAL | 4 refills | Status: DC

## 2020-06-10 NOTE — Assessment & Plan Note (Signed)
Stable, continue present medications.   

## 2020-06-10 NOTE — Progress Notes (Signed)
BP 120/80   Pulse 69   Temp 97.7 F (36.5 C) (Oral)   Ht 6\' 5"  (1.956 m)   Wt 198 lb (89.8 kg)   SpO2 98%   BMI 23.48 kg/m    Subjective:    Patient ID: Cameron Nelson, male    DOB: 1965/11/09, 54 y.o.   MRN: [id number]  HPI: Cameron Nelson is a 54 y.o. male  Chief Complaint  Patient presents with  . Annual Exam    pt wants cologuard   Social History   Socioeconomic History  . Marital status: Divorced    Spouse name: Not on file  . Number of children: Not on file  . Years of education: Not on file  . Highest education level: Not on file  Occupational History  . Not on file  Tobacco Use  . Smoking status: Former Smoker    Packs/day: 0.50    Years: 3.00    Pack years: 1.50    Types: Cigarettes    Quit date: 12/19/2004    Years since quitting: 15.4  . Smokeless tobacco: Never Used  Substance and Sexual Activity  . Alcohol use: Yes    Alcohol/week: 3.0 - 5.0 standard drinks    Types: 3 - 5 Cans of beer per week  . Drug use: No    Types: Marijuana  . Sexual activity: Not on file  Other Topics Concern  . Not on file  Social History Narrative  . Not on file   Social Determinants of Health   Financial Resource Strain:   . Difficulty of Paying Living Expenses: Not on file  Food Insecurity:   . Worried About 12/21/2004 in the Last Year: Not on file  . Ran Out of Food in the Last Year: Not on file  Transportation Needs:   . Lack of Transportation (Medical): Not on file  . Lack of Transportation (Non-Medical): Not on file  Physical Activity:   . Days of Exercise per Week: Not on file  . Minutes of Exercise per Session: Not on file  Stress:   . Feeling of Stress : Not on file  Social Connections:   . Frequency of Communication with Friends and Family: Not on file  . Frequency of Social Gatherings with Friends and Family: Not on file  . Attends Religious Services: Not on file  . Active Member of Clubs or Organizations: Not on file  . Attends Programme researcher, broadcasting/film/video Meetings: Not on file  . Marital Status: Not on file  Intimate Partner Violence:   . Fear of Current or Ex-Partner: Not on file  . Emotionally Abused: Not on file  . Physically Abused: Not on file  . Sexually Abused: Not on file   Family History  Problem Relation Age of Onset  . Hyperlipidemia Mother   . Cancer Father   . Hypertension Father   . Hyperlipidemia Father   . Cancer Maternal Grandfather   . Cancer Maternal Uncle    Past Medical History:  Diagnosis Date  . Anxiety   . Depression   . Hyperlipidemia   . Hypertension    Past Surgical History:  Procedure Laterality Date  . JOINT REPLACEMENT    . TONSILLECTOMY    . 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  Walking/hiking/gym  Depression screen [facility] 2/9 06/10/2020 05/13/2018 04/17/2017 02/17/2016  Decreased Interest 1 0 0 0  Down, Depressed, Hopeless 1 0 0 1  PHQ - 2 Score 2 0 0 1  Altered sleeping 1 - - 1  Tired, decreased energy 1 - - 0  Change in appetite 0 - - 0  Feeling bad or failure about yourself  1 - - 0  Trouble concentrating 1 - - 0  Moving slowly or fidgety/restless 0 - - 0  Suicidal thoughts 0 - - 0  PHQ-9 Score 6 - - 2  Difficult doing work/chores Somewhat difficult - - -     Relevant past medical, surgical, family and social history reviewed and updated as indicated. Interim medical history since our last visit reviewed. Allergies and medications reviewed and updated.  Review of Systems  Constitutional: Negative.   HENT: Negative.     Eyes: Negative.   Respiratory: Negative.   Cardiovascular: Negative.   Gastrointestinal: Negative.   Endocrine: Negative.   Genitourinary: Negative.   Musculoskeletal: Negative.   Allergic/Immunologic: Negative.   Neurological: Negative.   Hematological: Negative.     Per HPI unless specifically indicated above     Objective:    BP 120/80   Pulse 69   Temp 97.7 F (36.5 C) (Oral)   Ht 6\' 5"  (1.956 m)   Wt 198 lb (89.8 kg)   SpO2 98%   BMI 23.48 kg/m   Wt Readings from Last 3 Encounters:  06/10/20 198 lb (89.8 kg)  11/13/18 218 lb 8 oz (99.1 kg)  05/13/18 213 lb (96.6 kg)    Physical Exam Constitutional:      Appearance: He is well-developed.  HENT:     Head: Normocephalic.     Right Ear: Tympanic membrane, ear canal and external ear normal.     Left Ear: Tympanic membrane, ear canal and external ear normal.     Mouth/Throat:     Pharynx: Uvula midline.  Eyes:     Pupils: Pupils are equal, round, and reactive to light.  Cardiovascular:     Rate and Rhythm: Normal rate and regular rhythm.     Heart sounds: Normal heart sounds. No murmur heard.  No friction rub. No gallop.   Pulmonary:     Effort: Pulmonary effort is normal. No respiratory distress.     Breath sounds: Normal breath sounds.  Abdominal:     General: Bowel sounds are normal. There is no distension.     Palpations: Abdomen is soft.     Tenderness: There is no abdominal tenderness.  Genitourinary:    Comments: Refusing rectal exam Musculoskeletal:        General: Normal range of motion.  Skin:    General: Skin is warm and dry.  Neurological:     Mental Status: He is alert and oriented to person, place, and time.     Deep Tendon Reflexes: Reflexes are normal and symmetric.  Psychiatric:        Behavior: Behavior normal.        Thought Content: Thought content normal.        Judgment: Judgment normal.     Results for orders placed or performed in visit on 06/26/19  Novel Coronavirus, NAA  ([facility])   Specimen: Oropharyngeal(OP) collection  in vial transport medium   OROPHARYNGEA  TESTING  Result Value Ref Range   SARS-CoV-2, NAA Detected (A) Not Detected      Assessment & Plan:   Problem List Items Addressed This Visit      Unprioritized   Depression    This is stable without medications.  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        Hyperlipidemia    Stable, continue present medications.        Relevant Medications   atorvastatin (LIPITOR) 40 MG tablet   benazepril (LOTENSIN) 10 MG tablet   Other Relevant Orders   Comprehensive metabolic panel   Lipid Panel w/o Chol/HDL Ratio   Hypertension    Stable, continue present medications.        Relevant Medications   atorvastatin (LIPITOR) 40 MG tablet   benazepril (LOTENSIN) 10 MG tablet    Other Visit Diagnoses    Need for hepatitis C screening test    -  Primary   Relevant Orders   Hepatitis C antibody   Routine general medical examination at a health care facility       Relevant Orders   Comprehensive metabolic panel   CBC with Differential/Platelet   TSH   PSA   Lipid Panel w/o Chol/HDL Ratio   Colon cancer screening       Relevant Orders   Cologuard       Follow up plan: Return in about 1 year (around 06/10/2021).

## 2020-06-10 NOTE — Assessment & Plan Note (Signed)
This is stable without medications.  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

## 2020-06-11 LAB — CBC WITH DIFFERENTIAL/PLATELET
Basophils Absolute: 0 10*3/uL (ref 0.0–0.2)
Basos: 1 %
EOS (ABSOLUTE): 0.1 10*3/uL (ref 0.0–0.4)
Eos: 1 %
Hematocrit: 49 % (ref 37.5–51.0)
Hemoglobin: 17.1 g/dL (ref 13.0–17.7)
Immature Grans (Abs): 0 10*3/uL (ref 0.0–0.1)
Immature Granulocytes: 0 %
Lymphocytes Absolute: 1.1 10*3/uL (ref 0.7–3.1)
Lymphs: 20 %
MCH: 33.5 pg — ABNORMAL HIGH (ref 26.6–33.0)
MCHC: 34.9 g/dL (ref 31.5–35.7)
MCV: 96 fL (ref 79–97)
Monocytes Absolute: 0.7 10*3/uL (ref 0.1–0.9)
Monocytes: 12 %
Neutrophils Absolute: 3.8 10*3/uL (ref 1.4–7.0)
Neutrophils: 66 %
Platelets: 196 10*3/uL (ref 150–450)
RBC: 5.11 x10E6/uL (ref 4.14–5.80)
RDW: 13.1 % (ref 11.6–15.4)
WBC: 5.7 10*3/uL (ref 3.4–10.8)

## 2020-06-11 LAB — COMPREHENSIVE METABOLIC PANEL
ALT: 36 IU/L (ref 0–44)
AST: 32 IU/L (ref 0–40)
Albumin/Globulin Ratio: 1.7 (ref 1.2–2.2)
Albumin: 4.5 g/dL (ref 3.8–4.9)
Alkaline Phosphatase: 73 IU/L (ref 48–121)
BUN/Creatinine Ratio: 11 (ref 9–20)
BUN: 10 mg/dL (ref 6–24)
Bilirubin Total: 0.6 mg/dL (ref 0.0–1.2)
CO2: 26 mmol/L (ref 20–29)
Calcium: 9.3 mg/dL (ref 8.7–10.2)
Chloride: 100 mmol/L (ref 96–106)
Creatinine, Ser: 0.91 mg/dL (ref 0.76–1.27)
GFR calc Af Amer: 112 mL/min/{1.73_m2} (ref >59)
GFR calc non Af Amer: 97 mL/min/{1.73_m2} (ref >59)
Globulin, Total: 2.6 g/dL (ref 1.5–4.5)
Glucose: 98 mg/dL (ref 65–99)
Potassium: 4.3 mmol/L (ref 3.5–5.2)
Sodium: 138 mmol/L (ref 134–144)
Total Protein: 7.1 g/dL (ref 6.0–8.5)

## 2020-06-11 LAB — TSH: TSH: 2.02 u[IU]/mL (ref 0.450–4.500)

## 2020-06-11 LAB — LIPID PANEL W/O CHOL/HDL RATIO
Cholesterol, Total: 165 mg/dL (ref 100–199)
HDL: 46 mg/dL (ref >39)
LDL Chol Calc (NIH): 88 mg/dL (ref 0–99)
Triglycerides: 181 mg/dL — ABNORMAL HIGH (ref 0–149)
VLDL Cholesterol Cal: 31 mg/dL (ref 5–40)

## 2020-06-11 LAB — HEPATITIS C ANTIBODY: Hep C Virus Ab: <0.1 s/co ratio (ref 0.0–0.9)

## 2020-06-11 LAB — PSA: Prostate Specific Ag, Serum: 3.7 ng/mL (ref 0.0–4.0)

## 2020-06-11 NOTE — Addendum Note (Signed)
Addended by: Gabriel Cirri on: 06/11/2020 08:54 AM   Modules accepted: Orders

## 2020-06-29 LAB — COLOGUARD: Cologuard: NEGATIVE

## 2020-07-07 LAB — COLOGUARD: COLOGUARD: NEGATIVE

## 2020-07-07 LAB — EXTERNAL GENERIC LAB PROCEDURE: COLOGUARD: NEGATIVE

## 2020-07-08 ENCOUNTER — Telehealth: Payer: Self-pay

## 2020-07-08 NOTE — Telephone Encounter (Signed)
Called and notified patient of negative Cologuard result.

## 2021-01-05 ENCOUNTER — Telehealth: Payer: Self-pay | Admitting: *Deleted

## 2021-01-05 NOTE — Telephone Encounter (Signed)
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

## 2021-02-07 ENCOUNTER — Ambulatory Visit: Payer: BC Managed Care – PPO | Admitting: Family Medicine

## 2021-03-28 ENCOUNTER — Ambulatory Visit: Payer: BC Managed Care – PPO | Admitting: Family Medicine

## 2021-04-15 ENCOUNTER — Ambulatory Visit: Payer: Self-pay | Admitting: Nurse Practitioner

## 2021-05-23 ENCOUNTER — Ambulatory Visit: Payer: Self-pay | Admitting: Nurse Practitioner

## 2021-06-13 ENCOUNTER — Ambulatory Visit: Payer: Self-pay | Admitting: Nurse Practitioner

## 2021-07-04 ENCOUNTER — Other Ambulatory Visit: Payer: Self-pay | Admitting: Nurse Practitioner

## 2021-07-04 DIAGNOSIS — I10 Essential (primary) hypertension: Secondary | ICD-10-CM

## 2021-07-04 NOTE — Telephone Encounter (Signed)
Requested medications are due for refill today.  yes  Requested medications are on the active medications list.  yes  Last refill. 06/10/2020  Future visit scheduled.   yes  Notes to clinic.  Prescription is expired.

## 2021-07-04 NOTE — Telephone Encounter (Signed)
Medication: benazepril (LOTENSIN) 10 MG tablet [[id number]] Apt 07/19/21  Has the patient contacted their pharmacy? YES  (Agent: If no, request that the patient contact the pharmacy for the refill.) (Agent: If yes, when and what did the pharmacy advise?)  Preferred Pharmacy (with phone number or street name): [facility] - [place], [place] - [address] [place] [address] Phone: [phone] Fax: [phone] Hours: Not open 24 hours    Agent: Please be advised that RX refills may take up to 3 business days. We ask that you follow-up with your pharmacy.

## 2021-07-05 MED ORDER — BENAZEPRIL HCL 10 MG PO TABS: 10.0000 mg | ORAL_TABLET | Freq: Every day | ORAL | 0 refills | Status: DC

## 2021-07-05 NOTE — Addendum Note (Signed)
Addended byWilford Corner on: 07/05/2021 12:09 PM   Modules accepted: Orders

## 2021-07-05 NOTE — Telephone Encounter (Signed)
Please send to this pharmacy, request was made to wrong pharmacy previously for benazepril (LOTENSIN) 10 MG tablet  [facility] - [place], [place] - [address]  Cameron Nelson [place] [address]  Phone: [phone] Fax: [phone]  Hours: Not open 24 hours

## 2021-07-05 NOTE — Telephone Encounter (Signed)
Needs an appointment for future refills

## 2021-07-08 ENCOUNTER — Encounter: Payer: Self-pay | Admitting: Nurse Practitioner

## 2021-07-19 ENCOUNTER — Encounter: Payer: Self-pay | Admitting: Nurse Practitioner

## 2021-08-11 ENCOUNTER — Encounter: Payer: Self-pay | Admitting: Nurse Practitioner

## 2021-08-29 NOTE — Progress Notes (Signed)
BP (!) 150/84 (BP Location: Left Arm, Patient Position: Sitting)   Pulse 68   Temp 98.2 F (36.8 C) (Oral)   Resp 18   Ht 6\' 1"  (1.854 m)   Wt 194 lb (88 kg)   SpO2 99%   BMI 25.60 kg/m    Subjective:    Patient ID: , male    DOB: 20-Oct-1966, 55 y.o.   MRN: [id number]  HPI: Cameron Nelson is a 55 y.o. male presenting on 08/30/2021 for comprehensive medical examination. Current medical complaints include:none  HYPERTENSION / HYPERLIPIDEMIA  Satisfied with current treatment? yes Duration of hypertension: chronic BP monitoring frequency: a few times a week BP range: 130s-140s/80s BP medication side effects: no Past BP meds: lotensin Duration of hyperlipidemia: chronic Cholesterol medication side effects: no Cholesterol supplements: none Past cholesterol medications: atorvastatin  Medication compliance: excellent compliance Aspirin: no Recent stressors: no Recurrent headaches: no Visual changes: no Palpitations: no Dyspnea: no Chest pain: no Lower extremity edema: no Dizzy/lightheaded: no   He currently lives with: alone Interim Problems from his last visit: no  Depression Screen done today and results listed below:  Depression screen [facility] 2/9 08/30/2021 06/10/2020 05/13/2018 04/17/2017 02/17/2016  Decreased Interest 0 1 0 0 0  Down, Depressed, Hopeless 0 1 0 0 1  PHQ - 2 Score 0 2 0 0 1  Altered sleeping 0 1 - - 1  Tired, decreased energy 0 1 - - 0  Change in appetite 0 0 - - 0  Feeling bad or failure about yourself  0 1 - - 0  Trouble concentrating 0 1 - - 0  Moving slowly or fidgety/restless 0 0 - - 0  Suicidal thoughts 0 0 - - 0  PHQ-9 Score 0 6 - - 2  Difficult doing work/chores Not difficult at all Somewhat difficult - - -    The patient does not have a history of falls. I did not complete a risk assessment for falls. A plan of care for falls was not documented.   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 Swallow whole.   No current facility-administered medications on file prior to visit.    Allergies:  No Known Allergies  Social History:  Social History   Socioeconomic History   Marital status: Divorced    Spouse name: Not on file   Number of children: Not on file   Years of education: Not on file   Highest education level: Not on file  Occupational History   Not on file  Tobacco Use   Smoking status: Former    Packs/day: 0.50    Years: 3.00    Pack years: 1.50    Types: Cigarettes    Quit date: 12/19/2004    Years since quitting: 16.7   Smokeless tobacco: Never  Substance and Sexual Activity   Alcohol use:  Yes    Alcohol/week: 3.0 - 5.0 standard drinks    Types: 3 - 5 Cans of beer per week   Drug use: No    Types: Marijuana   Sexual activity: Not on file  Other Topics Concern   Not on file  Social History Narrative   Not on file   Social Determinants of Health   Financial Resource Strain: Not on file  Food Insecurity: Not on file  Transportation Needs: Not on file  Physical Activity: Not on file  Stress: Not on file  Social Connections: Not on file  Intimate Partner Violence: Not on file   Social History   Tobacco Use  Smoking Status Former   Packs/day: 0.50   Years: 3.00   Pack years: 1.50   Types: Cigarettes   Quit date: 12/19/2004   Years since quitting: 16.7  Smokeless Tobacco Never   Social History   Substance and  Sexual Activity  Alcohol Use Yes   Alcohol/week: 3.0 - 5.0 standard drinks   Types: 3 - 5 Cans of beer per week    Family History:  Family History  Problem Relation Age of Onset   Hyperlipidemia Mother    Cancer Father    Hypertension Father    Hyperlipidemia Father    Cancer Maternal Grandfather    Cancer Maternal Uncle     Past medical history, surgical history, medications, allergies, family history and social history reviewed with patient today and changes made to appropriate areas of the chart.   Review of Systems  Constitutional: Negative.   HENT: Negative.    Eyes: Negative.   Respiratory: Negative.    Cardiovascular: Negative.   Gastrointestinal: Negative.   Genitourinary: Negative.   Musculoskeletal: Negative.   Skin: Negative.   Neurological: Negative.   Psychiatric/Behavioral: Negative.    All other ROS negative except what is listed above and in the HPI.      Objective:    BP (!) 150/84 (BP Location: Left Arm, Patient Position: Sitting)   Pulse 68   Temp 98.2 F (36.8 C) (Oral)   Resp 18   Ht 6\' 1"  (1.854 m)   Wt 194 lb (88 kg)   SpO2 99%   BMI 25.60 kg/m   Wt Readings from Last 3 Encounters:  08/30/21 194 lb (88 kg)  06/10/20 198 lb (89.8 kg)  11/13/18 218 lb 8 oz (99.1 kg)    Physical Exam Vitals and nursing note reviewed.  Constitutional:      Appearance: Normal appearance.  HENT:     Head: Normocephalic and atraumatic.     Right Ear: Tympanic membrane, ear canal and external ear normal.     Left Ear: Tympanic membrane, ear canal and external ear normal.     Nose: Nose normal.     Mouth/Throat:     Mouth: Mucous membranes are moist.     Pharynx: Oropharynx is clear.  Eyes:     Conjunctiva/sclera: Conjunctivae normal.  Cardiovascular:     Rate and Rhythm: Normal rate and regular rhythm.     Pulses: Normal pulses.     Heart sounds: Normal heart sounds.  Pulmonary:     Effort: Pulmonary effort is normal.     Breath sounds: Normal  breath sounds.  Abdominal:     General: Bowel sounds are normal.     Palpations: Abdomen is soft.     Tenderness: There is no abdominal tenderness.  Musculoskeletal:        General: Normal range of  motion.     Cervical back: Normal range of motion and neck supple.     Right lower leg: No edema.     Left lower leg: No edema.     Comments: Strength 5/5 in bilateral upper and lower extremities  Lymphadenopathy:     Cervical: No cervical adenopathy.  Skin:    General: Skin is warm and dry.  Neurological:     General: No focal deficit present.     Mental Status: He is alert and oriented to person, place, and time.     Cranial Nerves: No cranial nerve deficit.     Gait: Gait normal.     Deep Tendon Reflexes: Reflexes normal.  Psychiatric:        Mood and Affect: Mood normal.        Behavior: Behavior normal.        Thought Content: Thought content normal.        Judgment: Judgment normal.    Results for orders placed or performed in visit on 07/08/20  Cologuard  Result Value Ref Range   Cologuard Negative Negative      Assessment & Plan:   Problem List Items Addressed This Visit       Cardiovascular and Mediastinum   Hypertension    Blood pressure elevated today at 150/84. His blood pressure at home has been running 130s-140s/80s. Discussed increasing benazepril, however he is hesitant to today. He states that his diet hasn't been the best and he would like to adjust his diet before increasing his medication. Follow up in 6 months, or sooner with concerns.       Relevant Medications   aspirin EC 81 MG tablet   atorvastatin (LIPITOR) 40 MG tablet   benazepril (LOTENSIN) 10 MG tablet   Other Relevant Orders   CBC with Differential/Platelet   Comprehensive metabolic panel     Other   Hyperlipidemia    Continue taking atorvastatin daily. Will check lipid panel and adjust as needed.       Relevant Medications   aspirin EC 81 MG tablet   atorvastatin (LIPITOR) 40 MG  tablet   benazepril (LOTENSIN) 10 MG tablet   Other Relevant Orders   CBC with Differential/Platelet   Comprehensive metabolic panel   Lipid Panel w/o Chol/HDL Ratio   Other Visit Diagnoses     Routine general medical examination at a health care facility    -  Primary   Health maintenance reviewed and updated. Declines shingrix today. Check CMP, CBC, TSH, and PSA today.    Relevant Orders   CBC with Differential/Platelet   Comprehensive metabolic panel   TSH   PSA   Need for tetanus booster       Td booster given today   Relevant Orders   Td : Tetanus/diphtheria >7yo Preservative  free        Discussed aspirin prophylaxis for myocardial infarction prevention and decision was made to continue ASA- he was told to take this daily for his hip replacement  LABORATORY TESTING:  Health maintenance labs ordered today as discussed above.   The natural history of prostate cancer and ongoing controversy regarding screening and potential treatment outcomes of prostate cancer has been discussed with the patient. The meaning of a false positive PSA and a false negative PSA has been discussed. He indicates understanding of the limitations of this screening test and wishes to proceed with screening PSA testing.   IMMUNIZATIONS:   - Tdap: Tetanus vaccination status reviewed: Td  vaccination indicated and given today. - Influenza: Up to date - Pneumovax: Not applicable - Prevnar: Not applicable - HPV: Not applicable - Zostavax vaccine: Refused  SCREENING: - Colonoscopy: Up to date  Discussed with patient purpose of the colonoscopy is to detect colon cancer at curable precancerous or early stages   - AAA Screening: Not applicable  -Hearing Test: Not applicable  -Spirometry: Not applicable   PATIENT COUNSELING:    Sexuality: Discussed sexually transmitted diseases, partner selection, use of condoms, avoidance of unintended pregnancy  and contraceptive alternatives.   Advised to avoid  cigarette smoking.  I discussed with the patient that most people either abstain from alcohol or drink within safe limits (<=14/week and <=4 drinks/occasion for males, <=7/weeks and <= 3 drinks/occasion for females) and that the risk for alcohol disorders and other health effects rises proportionally with the number of drinks per week and how often a drinker exceeds daily limits.  Discussed cessation/primary prevention of drug use and availability of treatment for abuse.   Diet: Encouraged to adjust caloric intake to maintain  or achieve ideal body weight, to reduce intake of dietary saturated fat and total fat, to limit sodium intake by avoiding high sodium foods and not adding table salt, and to maintain adequate dietary potassium and calcium preferably from fresh fruits, vegetables, and low-fat dairy products.    stressed the importance of regular exercise  Injury prevention: Discussed safety belts, safety helmets, smoke detector, smoking near bedding or upholstery.   Dental health: Discussed importance of regular tooth brushing, flossing, and dental visits.   Follow up plan: NEXT PREVENTATIVE PHYSICAL DUE IN 1 YEAR. Return in about 6 months (around 02/27/2022) for HTN.

## 2021-08-30 ENCOUNTER — Encounter: Payer: Self-pay | Admitting: Nurse Practitioner

## 2021-08-30 ENCOUNTER — Other Ambulatory Visit: Payer: Self-pay

## 2021-08-30 ENCOUNTER — Ambulatory Visit (INDEPENDENT_AMBULATORY_CARE_PROVIDER_SITE_OTHER): Payer: BC Managed Care – PPO | Admitting: Nurse Practitioner

## 2021-08-30 VITALS — BP 150/84 | HR 68 | Temp 98.2°F | Resp 18 | Ht 73.0 in | Wt 194.0 lb

## 2021-08-30 DIAGNOSIS — I10 Essential (primary) hypertension: Secondary | ICD-10-CM | POA: Diagnosis not present

## 2021-08-30 DIAGNOSIS — Z23 Encounter for immunization: Secondary | ICD-10-CM | POA: Diagnosis not present

## 2021-08-30 DIAGNOSIS — E7800 Pure hypercholesterolemia, unspecified: Secondary | ICD-10-CM

## 2021-08-30 DIAGNOSIS — R972 Elevated prostate specific antigen [PSA]: Secondary | ICD-10-CM

## 2021-08-30 DIAGNOSIS — Z0000 Encounter for general adult medical examination without abnormal findings: Secondary | ICD-10-CM | POA: Diagnosis not present

## 2021-08-30 MED ORDER — ATORVASTATIN CALCIUM 40 MG PO TABS: 40.0000 mg | ORAL_TABLET | Freq: Every day | ORAL | 3 refills | Status: DC

## 2021-08-30 MED ORDER — BENAZEPRIL HCL 10 MG PO TABS: 10.0000 mg | ORAL_TABLET | Freq: Every day | ORAL | 1 refills | Status: DC

## 2021-08-30 NOTE — Assessment & Plan Note (Signed)
Continue taking atorvastatin daily. Will check lipid panel and adjust as needed.

## 2021-08-30 NOTE — Assessment & Plan Note (Signed)
Blood pressure elevated today at 150/84. His blood pressure at home has been running 130s-140s/80s. Discussed increasing benazepril, however he is hesitant to today. He states that his diet hasn't been the best and he would like to adjust his diet before increasing his medication. Follow up in 6 months, or sooner with concerns.

## 2021-08-30 NOTE — Patient Instructions (Signed)
It was great to see you!  Check your blood pressure 3-4 days a week. Watch the amount of salt you are eating in your diet. You can look at nutrition labels and keep your sodium less than 2,000mg  daily.   Let's follow-up in 6 months, sooner if you have concerns.  If a referral was placed today, you will be contacted for an appointment. Please note that routine referrals can sometimes take up to 3-4 weeks to process. Please call our office if you haven't heard anything after this time frame.  Take care,  Rodman Pickle, NP

## 2021-08-31 LAB — COMPREHENSIVE METABOLIC PANEL
ALT: 40 IU/L (ref 0–44)
AST: 50 IU/L — ABNORMAL HIGH (ref 0–40)
Albumin/Globulin Ratio: 1.8 (ref 1.2–2.2)
Albumin: 4.5 g/dL (ref 3.8–4.9)
Alkaline Phosphatase: 73 IU/L (ref 44–121)
BUN/Creatinine Ratio: 9 (ref 9–20)
BUN: 9 mg/dL (ref 6–24)
Bilirubin Total: 1 mg/dL (ref 0.0–1.2)
CO2: 25 mmol/L (ref 20–29)
Calcium: 8.8 mg/dL (ref 8.7–10.2)
Chloride: 94 mmol/L — ABNORMAL LOW (ref 96–106)
Creatinine, Ser: 1.01 mg/dL (ref 0.76–1.27)
Globulin, Total: 2.5 g/dL (ref 1.5–4.5)
Glucose: 98 mg/dL (ref 70–99)
Potassium: 4.1 mmol/L (ref 3.5–5.2)
Sodium: 132 mmol/L — ABNORMAL LOW (ref 134–144)
Total Protein: 7 g/dL (ref 6.0–8.5)
eGFR: 89 mL/min/{1.73_m2} (ref >59)

## 2021-08-31 LAB — CBC WITH DIFFERENTIAL/PLATELET
Basophils Absolute: 0 10*3/uL (ref 0.0–0.2)
Basos: 1 %
EOS (ABSOLUTE): 0.1 10*3/uL (ref 0.0–0.4)
Eos: 1 %
Hematocrit: 49.1 % (ref 37.5–51.0)
Hemoglobin: 16.8 g/dL (ref 13.0–17.7)
Immature Grans (Abs): 0 10*3/uL (ref 0.0–0.1)
Immature Granulocytes: 0 %
Lymphocytes Absolute: 1 10*3/uL (ref 0.7–3.1)
Lymphs: 18 %
MCH: 32.7 pg (ref 26.6–33.0)
MCHC: 34.2 g/dL (ref 31.5–35.7)
MCV: 96 fL (ref 79–97)
Monocytes Absolute: 0.8 10*3/uL (ref 0.1–0.9)
Monocytes: 14 %
Neutrophils Absolute: 3.8 10*3/uL (ref 1.4–7.0)
Neutrophils: 66 %
Platelets: 183 10*3/uL (ref 150–450)
RBC: 5.13 x10E6/uL (ref 4.14–5.80)
RDW: 12.5 % (ref 11.6–15.4)
WBC: 5.6 10*3/uL (ref 3.4–10.8)

## 2021-08-31 LAB — LIPID PANEL W/O CHOL/HDL RATIO
Cholesterol, Total: 186 mg/dL (ref 100–199)
HDL: 67 mg/dL (ref >39)
LDL Chol Calc (NIH): 92 mg/dL (ref 0–99)
Triglycerides: 161 mg/dL — ABNORMAL HIGH (ref 0–149)
VLDL Cholesterol Cal: 27 mg/dL (ref 5–40)

## 2021-08-31 LAB — PSA: Prostate Specific Ag, Serum: 6.1 ng/mL — ABNORMAL HIGH (ref 0.0–4.0)

## 2021-08-31 LAB — TSH: TSH: 3.63 u[IU]/mL (ref 0.450–4.500)

## 2021-08-31 NOTE — Addendum Note (Signed)
Addended by: Rodman Pickle A on: 08/31/2021 07:59 AM   Modules accepted: Orders

## 2021-09-14 ENCOUNTER — Ambulatory Visit: Payer: BC Managed Care – PPO | Admitting: Urology

## 2021-09-21 ENCOUNTER — Encounter: Payer: Self-pay | Admitting: Urology

## 2021-09-21 ENCOUNTER — Ambulatory Visit: Payer: BC Managed Care – PPO | Admitting: Urology

## 2021-09-21 ENCOUNTER — Other Ambulatory Visit: Payer: Self-pay

## 2021-09-21 VITALS — BP 128/79 | HR 70 | Ht 73.0 in | Wt 197.0 lb

## 2021-09-21 DIAGNOSIS — R972 Elevated prostate specific antigen [PSA]: Secondary | ICD-10-CM | POA: Diagnosis not present

## 2021-09-21 NOTE — Progress Notes (Signed)
09/21/2021 5:06 PM   Cameron Nelson 1966-06-18 [id number]  Referring provider: Gerre Scull, NP [address] [place],  [place] [address]  Chief Complaint  Patient presents with   Elevated PSA    HPI: Cameron Nelson is a 55 y.o. male who presents for evaluation of an elevated PSA.  PSA 08/30/2021 was 6.1 Did have intercourse the night prior to his lab draw No bothersome LUTS Denies dysuria, gross hematuria Denies flank, pelvic pain Does have chronic left low back pain He is a third grade teacher and states he has to hold his bowel movements until after school and does note occasional lower abdominal discomfort Maternal grandfather had prostate cancer but no history of prostate cancer in first-degree relatives   PMH: Past Medical History:  Diagnosis Date   Anxiety    Bursitis of right shoulder 11/13/2018   Depression    Hyperlipidemia    Hypertension     Surgical History: Past Surgical History:  Procedure Laterality Date   JOINT REPLACEMENT     TONSILLECTOMY     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 If you have any questions, ask your nurse or doctor.          aspirin EC 81 MG tablet Take 81 mg by mouth daily. Swallow whole.   atorvastatin 40 MG tablet Commonly known as: LIPITOR Take 1 tablet (40 mg total) by mouth daily.   benazepril 10 MG tablet Commonly known as: LOTENSIN Take 1 tablet (10 mg total) by mouth daily. Need an appointment for future refills        Allergies: No Known Allergies  Family History: Family History  Problem Relation Age of Onset   Hyperlipidemia Mother    Cancer Father    Hypertension Father    Hyperlipidemia Father     Cancer Maternal Grandfather    Cancer Maternal Uncle     Social History:  reports that he quit smoking about 16 years ago. His smoking use included cigarettes. He has a 1.50 pack-year smoking history. He has never used smokeless tobacco. He reports current alcohol use of about 3.0 - 5.0 standard drinks per week. He reports that he does not use drugs.   Physical Exam: BP 128/79   Pulse 70   Ht 6\' 1"  (1.854 m)   Wt 197 lb (89.4 kg)   BMI 25.99 kg/m   Constitutional:  Alert and oriented, No acute distress. HEENT: [place] AT, moist mucus membranes.  Trachea midline, no masses. Cardiovascular: No clubbing, cyanosis, or edema. Respiratory: Normal respiratory effort, no increased work of breathing. GI: Abdomen is soft, nontender, nondistended, no abdominal masses GU: Prostate 50 g, smooth without nodules Skin: No rashes, bruises or suspicious lesions. Neurologic: Grossly intact, no focal deficits, moving all 4 extremities. Psychiatric: Normal mood and affect.   Assessment & Plan:    1.  Elevated PSA Benign DRE Although PSA is a prostate cancer screening test he was informed that cancer is not the most common cause of an elevated PSA. Other potential causes including BPH and inflammation were discussed. He was informed that the only way to adequately diagnose prostate cancer would be  a transrectal ultrasound and biopsy of the prostate. The procedure was discussed including potential risks of bleeding and infection/sepsis. He was also informed that a negative biopsy does not conclusively rule out the possibility that prostate cancer may be present and that continued monitoring is required. The use of newer adjunctive blood tests including 4kScore was discussed. The use of multiparametric prostate MRI to identify lesions suspicious for high-grade prostate cancer and increase diagnostic biopsy yield was reviewed. Continued periodic surveillance was also discussed. He has elected surveillance and states  he has a follow-up lab visit scheduled in early May 2022 Recommend abstain from intercourse/ejaculation 24 hours prior to blood draw   Abbie Sons, [place] [address], [place], [place] [address] [phone]

## 2021-11-30 ENCOUNTER — Other Ambulatory Visit: Payer: Self-pay | Admitting: Nurse Practitioner

## 2021-11-30 DIAGNOSIS — I10 Essential (primary) hypertension: Secondary | ICD-10-CM

## 2022-02-27 ENCOUNTER — Ambulatory Visit: Payer: BC Managed Care – PPO | Admitting: Nurse Practitioner

## 2022-03-06 ENCOUNTER — Telehealth: Payer: Self-pay | Admitting: Urology

## 2022-03-06 NOTE — Telephone Encounter (Signed)
Pt called to cancel lab appt, he has pcp appt 6/8 and will have them draw PSA ?

## 2022-03-06 NOTE — Telephone Encounter (Signed)
Left message on voice message appt made  ?

## 2022-03-06 NOTE — Telephone Encounter (Signed)
Seen November 2022 for an elevated PSA and repeat PSA recommended this month.  Please schedule lab visit for PSA ?

## 2022-03-22 ENCOUNTER — Ambulatory Visit: Payer: BC Managed Care – PPO | Admitting: Nurse Practitioner

## 2022-03-23 ENCOUNTER — Other Ambulatory Visit: Payer: BC Managed Care – PPO

## 2022-03-30 ENCOUNTER — Ambulatory Visit: Payer: BC Managed Care – PPO | Admitting: Nurse Practitioner

## 2022-04-20 ENCOUNTER — Ambulatory Visit: Payer: BC Managed Care – PPO | Admitting: Nurse Practitioner

## 2022-05-11 ENCOUNTER — Ambulatory Visit: Payer: BC Managed Care – PPO | Admitting: Nurse Practitioner

## 2022-05-31 ENCOUNTER — Ambulatory Visit: Payer: BC Managed Care – PPO | Admitting: Nurse Practitioner

## 2022-06-15 ENCOUNTER — Ambulatory Visit: Payer: BC Managed Care – PPO | Admitting: Family Medicine

## 2022-06-15 ENCOUNTER — Encounter: Payer: Self-pay | Admitting: Family Medicine

## 2022-06-15 VITALS — BP 128/72 | HR 59 | Temp 97.7°F | Wt 198.7 lb

## 2022-06-15 DIAGNOSIS — I10 Essential (primary) hypertension: Secondary | ICD-10-CM

## 2022-06-15 DIAGNOSIS — E7800 Pure hypercholesterolemia, unspecified: Secondary | ICD-10-CM | POA: Diagnosis not present

## 2022-06-15 MED ORDER — ATORVASTATIN CALCIUM 40 MG PO TABS: 40.0000 mg | ORAL_TABLET | Freq: Every day | ORAL | 1 refills | Status: DC

## 2022-06-15 MED ORDER — BENAZEPRIL HCL 10 MG PO TABS: ORAL_TABLET | ORAL | 1 refills | Status: DC

## 2022-06-15 NOTE — Progress Notes (Signed)
BP 128/72   Pulse (!) 59   Temp 97.7 F (36.5 C)   Wt 198 lb 11.2 oz (90.1 kg)   SpO2 97%   BMI 26.22 kg/m    Subjective:    Patient ID: Cameron Nelson, male    DOB: Jul 25, 1966, 57 y.o.   MRN: [id number]  HPI: Cameron Nelson is a 56 y.o. male  Chief Complaint  Patient presents with   Hypertension   Hyperlipidemia   HYPERTENSION / [place] Satisfied with current treatment? yes Duration of hypertension: chronic BP monitoring frequency: not checking BP medication side effects: no Past BP meds: benazepril Duration of hyperlipidemia: chronic Cholesterol medication side effects: no Cholesterol supplements: none Past cholesterol medications: atorvastatin Medication compliance: excellent compliance Aspirin: no Recent stressors: no Recurrent headaches: no Visual changes: no Palpitations: no Dyspnea: no Chest pain: no Lower extremity edema: no Dizzy/lightheaded: no  Relevant past medical, surgical, family and social history reviewed and updated as indicated. Interim medical history since our last visit reviewed. Allergies and medications reviewed and updated.  Review of Systems  Constitutional: Negative.   Respiratory: Negative.    Cardiovascular: Negative.   Gastrointestinal: Negative.   Musculoskeletal: Negative.   Neurological: Negative.   Psychiatric/Behavioral: Negative.      Per HPI unless specifically indicated above     Objective:    BP 128/72   Pulse (!) 59   Temp 97.7 F (36.5 C)   Wt 198 lb 11.2 oz (90.1 kg)   SpO2 97%   BMI 26.22 kg/m   Wt Readings from Last 3 Encounters:  06/15/22 198 lb 11.2 oz (90.1 kg)  09/21/21 197 lb (89.4 kg)  08/30/21 194 lb (88 kg)    Physical Exam Vitals and nursing note reviewed.  Constitutional:      General: He is not in acute distress.    Appearance: Normal appearance. He is not ill-appearing, toxic-appearing or diaphoretic.  HENT:     Head: Normocephalic and atraumatic.     Right Ear: External ear  normal.     Left Ear: External ear normal.     Nose: Nose normal.     Mouth/Throat:     Mouth: Mucous membranes are moist.     Pharynx: Oropharynx is clear.  Eyes:     General: No scleral icterus.       Right eye: No discharge.        Left eye: No discharge.     Extraocular Movements: Extraocular movements intact.     Conjunctiva/sclera: Conjunctivae normal.     Pupils: Pupils are equal, round, and reactive to light.  Cardiovascular:     Rate and Rhythm: Normal rate and regular rhythm.     Pulses: Normal pulses.     Heart sounds: Normal heart sounds. No murmur heard.    No friction rub. No gallop.  Pulmonary:     Effort: Pulmonary effort is normal. No respiratory distress.     Breath sounds: Normal breath sounds. No stridor. No wheezing, rhonchi or rales.  Chest:     Chest wall: No tenderness.  Musculoskeletal:        General: Normal range of motion.     Cervical back: Normal range of motion and neck supple.  Skin:    General: Skin is warm and dry.     Capillary Refill: Capillary refill takes less than 2 seconds.     Coloration: Skin is not jaundiced or pale.     Findings: No bruising, erythema, lesion or rash.  Neurological:  General: No focal deficit present.     Mental Status: He is alert and oriented to person, place, and time. Mental status is at baseline.  Psychiatric:        Mood and Affect: Mood normal.        Behavior: Behavior normal.        Thought Content: Thought content normal.        Judgment: Judgment normal.     Results for orders placed or performed in visit on 08/30/21  CBC with Differential/Platelet  Result Value Ref Range   WBC 5.6 3.4 - 10.8 x10E3/uL   RBC 5.13 4.14 - 5.80 x10E6/uL   Hemoglobin 16.8 13.0 - 17.7 g/dL   Hematocrit 49.1 37.5 - 51.0 %   MCV 96 79 - 97 fL   MCH 32.7 26.6 - 33.0 pg   MCHC 34.2 31.5 - 35.7 g/dL   RDW 12.5 11.6 - 15.4 %   Platelets 183 150 - 450 x10E3/uL   Neutrophils 66 Not Estab. %   Lymphs 18 Not Estab. %    Monocytes 14 Not Estab. %   Eos 1 Not Estab. %   Basos 1 Not Estab. %   Neutrophils Absolute 3.8 1.4 - 7.0 x10E3/uL   Lymphocytes Absolute 1.0 0.7 - 3.1 x10E3/uL   Monocytes Absolute 0.8 0.1 - 0.9 x10E3/uL   EOS (ABSOLUTE) 0.1 0.0 - 0.4 x10E3/uL   Basophils Absolute 0.0 0.0 - 0.2 x10E3/uL   Immature Granulocytes 0 Not Estab. %   Immature Grans (Abs) 0.0 0.0 - 0.1 x10E3/uL  Comprehensive metabolic panel  Result Value Ref Range   Glucose 98 70 - 99 mg/dL   BUN 9 6 - 24 mg/dL   Creatinine, Ser 1.01 0.76 - 1.27 mg/dL   eGFR 89 >59 mL/min/1.73   BUN/Creatinine Ratio 9 9 - 20   Sodium 132 (L) 134 - 144 mmol/L   Potassium 4.1 3.5 - 5.2 mmol/L   Chloride 94 (L) 96 - 106 mmol/L   CO2 25 20 - 29 mmol/L   Calcium 8.8 8.7 - 10.2 mg/dL   Total Protein 7.0 6.0 - 8.5 g/dL   Albumin 4.5 3.8 - 4.9 g/dL   Globulin, Total 2.5 1.5 - 4.5 g/dL   Albumin/Globulin Ratio 1.8 1.2 - 2.2   Bilirubin Total 1.0 0.0 - 1.2 mg/dL   Alkaline Phosphatase 73 44 - 121 IU/L   AST 50 (H) 0 - 40 IU/L   ALT 40 0 - 44 IU/L  Lipid Panel w/o Chol/HDL Ratio  Result Value Ref Range   Cholesterol, Total 186 100 - 199 mg/dL   Triglycerides 161 (H) 0 - 149 mg/dL   HDL 67 >39 mg/dL   VLDL Cholesterol Cal 27 5 - 40 mg/dL   LDL Chol Calc (NIH) 92 0 - 99 mg/dL  TSH  Result Value Ref Range   TSH 3.630 0.450 - 4.500 uIU/mL  PSA  Result Value Ref Range   Prostate Specific Ag, Serum 6.1 (H) 0.0 - 4.0 ng/mL      Assessment & Plan:   Problem List Items Addressed This Visit       Cardiovascular and Mediastinum   Hypertension - Primary    Under good control on current regimen. Continue current regimen. Continue to monitor. Call with any concerns. Refills given. Labs drawn today.       Relevant Medications   benazepril (LOTENSIN) 10 MG tablet   atorvastatin (LIPITOR) 40 MG tablet   Other Relevant Orders   Comprehensive metabolic  panel     Other   Hyperlipidemia    Under good control on current regimen. Continue  current regimen. Continue to monitor. Call with any concerns. Refills given. Labs drawn today.       Relevant Medications   benazepril (LOTENSIN) 10 MG tablet   atorvastatin (LIPITOR) 40 MG tablet   Other Relevant Orders   Comprehensive metabolic panel   Lipid Panel w/o Chol/HDL Ratio     Follow up plan: Return in about 6 months (around 12/16/2022) for physical.

## 2022-06-15 NOTE — Assessment & Plan Note (Signed)
Under good control on current regimen. Continue current regimen. Continue to monitor. Call with any concerns. Refills given. Labs drawn today.   

## 2022-06-16 LAB — COMPREHENSIVE METABOLIC PANEL
ALT: 35 IU/L (ref 0–44)
AST: 34 IU/L (ref 0–40)
Albumin/Globulin Ratio: 1.8 (ref 1.2–2.2)
Albumin: 4.3 g/dL (ref 3.8–4.9)
Alkaline Phosphatase: 82 IU/L (ref 44–121)
BUN/Creatinine Ratio: 11 (ref 9–20)
BUN: 9 mg/dL (ref 6–24)
Bilirubin Total: 0.3 mg/dL (ref 0.0–1.2)
CO2: 20 mmol/L (ref 20–29)
Calcium: 9.2 mg/dL (ref 8.7–10.2)
Chloride: 100 mmol/L (ref 96–106)
Creatinine, Ser: 0.82 mg/dL (ref 0.76–1.27)
Globulin, Total: 2.4 g/dL (ref 1.5–4.5)
Glucose: 93 mg/dL (ref 70–99)
Potassium: 4.1 mmol/L (ref 3.5–5.2)
Sodium: 141 mmol/L (ref 134–144)
Total Protein: 6.7 g/dL (ref 6.0–8.5)
eGFR: 105 mL/min/{1.73_m2} (ref >59)

## 2022-06-16 LAB — LIPID PANEL W/O CHOL/HDL RATIO
Cholesterol, Total: 159 mg/dL (ref 100–199)
HDL: 67 mg/dL (ref >39)
LDL Chol Calc (NIH): 74 mg/dL (ref 0–99)
Triglycerides: 100 mg/dL (ref 0–149)
VLDL Cholesterol Cal: 18 mg/dL (ref 5–40)

## 2022-09-18 ENCOUNTER — Other Ambulatory Visit: Payer: Self-pay | Admitting: Family Medicine

## 2022-09-18 DIAGNOSIS — E7800 Pure hypercholesterolemia, unspecified: Secondary | ICD-10-CM

## 2022-09-19 NOTE — Telephone Encounter (Signed)
Unable to refill per protocol, Rx request was too soon last refill 06/15/22 for 90 and 1 rf. Will refuse.  Requested Prescriptions  Pending Prescriptions Disp Refills   atorvastatin (LIPITOR) 40 MG tablet [Pharmacy Med Name: ATORVASTATIN CALCIUM 40 MG TAB] 90 tablet 1    Sig: TAKE ONE TABLET BY MOUTH DAILY     Cardiovascular:  Antilipid - Statins Failed - 09/18/2022 11:08 AM      Failed - Lipid Panel in normal range within the last 12 months    Cholesterol, Total  Date Value Ref Range Status  06/15/2022 159 100 - 199 mg/dL Final   Cholesterol Piccolo, Waived  Date Value Ref Range Status  11/13/2018 175 <200 mg/dL Final    Comment:                            Desirable                <200                         Borderline High      200- 239                         High                     >239    LDL Chol Calc (NIH)  Date Value Ref Range Status  06/15/2022 74 0 - 99 mg/dL Final   HDL  Date Value Ref Range Status  06/15/2022 67 >39 mg/dL Final   Triglycerides  Date Value Ref Range Status  06/15/2022 100 0 - 149 mg/dL Final   Triglycerides Piccolo,Waived  Date Value Ref Range Status  11/13/2018 391 (H) <150 mg/dL Final    Comment:                            Normal                   <150                         Borderline High     150 - 199                         High                200 - 499                         Very High                >499          Passed - Patient is not pregnant      Passed - Valid encounter within last 12 months    Recent Outpatient Visits           3 months ago Primary hypertension   [facility] [place], Megan P, DO   1 year ago Routine general medical examination at a health care facility   [facility], Lauren A, NP   2 years ago Need for hepatitis C screening test   [facility] Gabriel Cirri, NP   3 years ago PE (physical exam), annual  [facility] [place], Redge Gainer, MD    3 years ago Essential hypertension   [facility] [place], Redge Gainer, MD       Future Appointments             In 3 months Larae Grooms, NP [facility], [facility]

## 2022-12-19 ENCOUNTER — Encounter: Payer: BC Managed Care – PPO | Admitting: Nurse Practitioner

## 2023-01-11 ENCOUNTER — Other Ambulatory Visit: Payer: Self-pay | Admitting: Family Medicine

## 2023-01-11 DIAGNOSIS — I10 Essential (primary) hypertension: Secondary | ICD-10-CM

## 2023-01-11 NOTE — Telephone Encounter (Signed)
Requested medication (s) are due for refill today: yes   Requested medication (s) are on the active medication list: yes  Last refill:  06/15/22 #90 1 refills  Future visit scheduled: yes in 1 week.  Notes to clinic:  protocol failed last labs 06/15/22. Do you want to give a courtesy refill?     Requested Prescriptions  Pending Prescriptions Disp Refills   benazepril (LOTENSIN) 10 MG tablet [Pharmacy Med Name: BENAZEPRIL HCL 10 MG TAB] 90 tablet 1    Sig: TAKE ONE TABLET BY MOUTH EVERY DAY. NEEDAPPT FOR FUTURE REFILLS     Cardiovascular:  ACE Inhibitors Failed - 01/11/2023 11:28 AM      Failed - Cr in normal range and within 180 days    Creatinine, Ser  Date Value Ref Range Status  06/15/2022 0.82 0.76 - 1.27 mg/dL Final         Failed - K in normal range and within 180 days    Potassium  Date Value Ref Range Status  06/15/2022 4.1 3.5 - 5.2 mmol/L Final         Failed - Valid encounter within last 6 months    Recent Outpatient Visits           7 months ago Primary hypertension   [place], [place], DO   1 year ago Routine general medical examination at a health care facility   [place], Lauren A, NP   2 years ago Need for hepatitis C screening test   [place] Kathrine Haddock, NP   3 years ago PE (physical exam), annual   [place] Guadalupe Maple, MD   4 years ago Essential hypertension   [place], MD       Future Appointments             In 1 week Jon Billings, NP [place], [place] - Patient is not pregnant      Passed - Last BP in normal range    BP Readings from Last 1 Encounters:  06/15/22 128/72

## 2023-01-17 ENCOUNTER — Other Ambulatory Visit: Payer: Self-pay | Admitting: Nurse Practitioner

## 2023-01-17 DIAGNOSIS — I10 Essential (primary) hypertension: Secondary | ICD-10-CM

## 2023-01-17 NOTE — Telephone Encounter (Unsigned)
Copied from [place] [id number]. Topic: General - Other >> Jan 17, 2023  1:35 PM Everette C wrote: Reason for CRM: Medication Refill - Medication: benazepril (LOTENSIN) 10 MG tablet [id number]  Has the patient contacted their pharmacy? Yes.   (Agent: If no, request that the patient contact the pharmacy for the refill. If patient does not wish to contact the pharmacy document the reason why and proceed with request.) (Agent: If yes, when and what did the pharmacy advise?)  Preferred Pharmacy (with phone number or street name): [place], [place] [address] [place] [address] Phone: [phone] Fax: [phone] Hours: Not open 24 hours   Has the patient been seen for an appointment in the last year OR does the patient have an upcoming appointment? Yes.    Agent: Please be advised that RX refills may take up to 3 business days. We ask that you follow-up with your pharmacy.

## 2023-01-18 MED ORDER — BENAZEPRIL HCL 10 MG PO TABS: ORAL_TABLET | ORAL | 0 refills | Status: DC

## 2023-01-18 NOTE — Telephone Encounter (Signed)
Future OV scheduled 02/15/23. Request is not too soon for refill.  Requested Prescriptions  Pending Prescriptions Disp Refills   benazepril (LOTENSIN) 10 MG tablet 90 tablet 0    Sig: TAKE ONE TABLET BY MOUTH EVERY DAY. NEEDAPPT FOR FUTURE REFILLS     Cardiovascular:  ACE Inhibitors Failed - 01/17/2023  2:25 PM      Failed - Cr in normal range and within 180 days    Creatinine, Ser  Date Value Ref Range Status  06/15/2022 0.82 0.76 - 1.27 mg/dL Final         Failed - K in normal range and within 180 days    Potassium  Date Value Ref Range Status  06/15/2022 4.1 3.5 - 5.2 mmol/L Final         Failed - Valid encounter within last 6 months    Recent Outpatient Visits           7 months ago Primary hypertension   [place], [place], DO   1 year ago Routine general medical examination at a health care facility   [place], Lauren A, NP   2 years ago Need for hepatitis C screening test   [place] Kathrine Haddock, NP   3 years ago PE (physical exam), annual   [place] Guadalupe Maple, MD   4 years ago Essential hypertension   [place], MD       Future Appointments             In 4 weeks Jon Billings, NP [place], [place] - Patient is not pregnant      Passed - Last BP in normal range    BP Readings from Last 1 Encounters:  06/15/22 128/72

## 2023-01-23 ENCOUNTER — Encounter: Payer: BC Managed Care – PPO | Admitting: Nurse Practitioner

## 2023-02-15 ENCOUNTER — Encounter: Payer: BC Managed Care – PPO | Admitting: Nurse Practitioner

## 2023-02-22 ENCOUNTER — Ambulatory Visit (INDEPENDENT_AMBULATORY_CARE_PROVIDER_SITE_OTHER): Payer: BC Managed Care – PPO | Admitting: Nurse Practitioner

## 2023-02-22 ENCOUNTER — Encounter: Payer: Self-pay | Admitting: Nurse Practitioner

## 2023-02-22 VITALS — BP 146/77 | HR 72 | Temp 97.8°F | Ht 73.6 in | Wt 182.2 lb

## 2023-02-22 DIAGNOSIS — E7800 Pure hypercholesterolemia, unspecified: Secondary | ICD-10-CM

## 2023-02-22 DIAGNOSIS — I10 Essential (primary) hypertension: Secondary | ICD-10-CM

## 2023-02-22 DIAGNOSIS — Z0000 Encounter for general adult medical examination without abnormal findings: Secondary | ICD-10-CM | POA: Diagnosis not present

## 2023-02-22 LAB — URINALYSIS, ROUTINE W REFLEX MICROSCOPIC
Bilirubin, UA: NEGATIVE
Glucose, UA: NEGATIVE
Ketones, UA: NEGATIVE
Leukocytes,UA: NEGATIVE
Nitrite, UA: NEGATIVE
Protein,UA: NEGATIVE
Specific Gravity, UA: <1.005 — ABNORMAL LOW (ref 1.005–1.030)
Urobilinogen, Ur: 0.2 mg/dL (ref 0.2–1.0)
pH, UA: 5.5 (ref 5.0–7.5)

## 2023-02-22 LAB — MICROSCOPIC EXAMINATION
Bacteria, UA: NONE SEEN
Epithelial Cells (non renal): NONE SEEN /hpf (ref 0–10)
WBC, UA: NONE SEEN /hpf (ref 0–5)

## 2023-02-22 MED ORDER — BENAZEPRIL HCL 20 MG PO TABS: 20.0000 mg | ORAL_TABLET | Freq: Every day | ORAL | 1 refills | Status: DC

## 2023-02-22 MED ORDER — ATORVASTATIN CALCIUM 40 MG PO TABS: 40.0000 mg | ORAL_TABLET | Freq: Every day | ORAL | 1 refills | Status: DC

## 2023-02-22 NOTE — Progress Notes (Signed)
BP (!) 146/77 (BP Location: Left Arm, Cuff Size: Normal)   Pulse 72   Temp 97.8 F (36.6 C) (Oral)   Ht 6' 1.6" (1.869 m)   Wt 182 lb 3.2 oz (82.6 kg)   SpO2 98%   BMI 23.65 kg/m    Subjective:    Patient ID: Cameron Nelson, male    DOB: 1966/04/25, 57 y.o.   MRN: [id number]  HPI: Cameron Nelson is a 57 y.o. male presenting on 02/22/2023 for comprehensive medical examination. Current medical complaints include:none  He currently lives with: Interim Problems from his last visit: no  HYPERTENSION / HYPERLIPIDEMIA Satisfied with current treatment? no Duration of hypertension: years BP monitoring frequency: daily BP range: 130-160/80 BP medication side effects: no Past BP meds: benazepril Duration of hyperlipidemia: years Cholesterol medication side effects: no Cholesterol supplements: none Past cholesterol medications: atorvastain (lipitor) Medication compliance: excellent compliance Aspirin: yes Recent stressors: no Recurrent headaches: no Visual changes: no Palpitations: no Dyspnea: no Chest pain: no Lower extremity edema: no Dizzy/lightheaded: no   Depression Screen done today and results listed below:     02/22/2023    8:31 AM 06/15/2022    8:09 AM 08/30/2021    8:15 AM 06/10/2020    8:26 AM 05/13/2018    8:02 AM  Depression screen PHQ 2/9  Decreased Interest 1 1 0 1 0  Down, Depressed, Hopeless 1 2 0 1 0  PHQ - 2 Score 2 3 0 2 0  Altered sleeping 2 0 0 1   Tired, decreased energy 1 1 0 1   Change in appetite 2 1 0 0   Feeling bad or failure about yourself  1 1 0 1   Trouble concentrating 1 1 0 1   Moving slowly or fidgety/restless 0 0 0 0   Suicidal thoughts 0 0 0 0   PHQ-9 Score 9 7 0 6   Difficult doing work/chores Somewhat difficult Somewhat difficult Not difficult at all Somewhat difficult     The patient does not have a history of falls. I did complete a risk assessment for falls. A plan of care for falls was documented.   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 Swallow whole.   No current facility-administered medications on file prior to visit.    Allergies:  No Known Allergies  Social History:  Social History   Socioeconomic History   Marital status: Divorced    Spouse name: Not on file   Number of children: Not on file   Years of education: Not on file   Highest education level: Not on file  Occupational History   Not on file  Tobacco Use   Smoking status: Former    Packs/day: 0.50    Years: 3.00    Additional pack years: 0.00    Total pack years: 1.50  Types: Cigarettes    Quit date: 12/19/2004    Years since quitting: 18.1   Smokeless tobacco: Never  Vaping Use   Vaping Use: Never used  Substance and Sexual Activity   Alcohol use: Yes   Drug use: No    Types: Marijuana   Sexual activity: Yes    Birth control/protection: None  Other Topics Concern   Not on file  Social History Narrative   Not on file   Social Determinants of Health   Financial Resource Strain: Not on file  Food Insecurity: Not on file  Transportation Needs: Not on file  Physical Activity: Not on file  Stress: Not on file  Social Connections: Not on file  Intimate Partner Violence: Not on file   Social History   Tobacco Use  Smoking Status Former   Packs/day: 0.50   Years: 3.00   Additional pack years: 0.00   Total pack years: 1.50   Types: Cigarettes    Quit date: 12/19/2004   Years since quitting: 18.1  Smokeless Tobacco Never   Social History   Substance and Sexual Activity  Alcohol Use Yes    Family History:  Family History  Problem Relation Age of Onset   Hyperlipidemia Mother    Cancer Father    Hypertension Father    Hyperlipidemia Father    Cancer Maternal Grandfather    Cancer Maternal Uncle     Past medical history, surgical history, medications, allergies, family history and social history reviewed with patient today and changes made to appropriate areas of the chart.   Review of Systems  Eyes:  Negative for blurred vision and double vision.  Respiratory:  Negative for shortness of breath.   Cardiovascular:  Negative for chest pain, palpitations and leg swelling.  Neurological:  Negative for dizziness and headaches.   All other ROS negative except what is listed above and in the HPI.      Objective:    BP (!) 146/77 (BP Location: Left Arm, Cuff Size: Normal)   Pulse 72   Temp 97.8 F (36.6 C) (Oral)   Ht 6' 1.6" (1.869 m)   Wt 182 lb 3.2 oz (82.6 kg)   SpO2 98%   BMI 23.65 kg/m   Wt Readings from Last 3 Encounters:  02/22/23 182 lb 3.2 oz (82.6 kg)  06/15/22 198 lb 11.2 oz (90.1 kg)  09/21/21 197 lb (89.4 kg)    Physical Exam Vitals and nursing note reviewed.  Constitutional:      General: He is not in acute distress.    Appearance: Normal appearance. He is not ill-appearing, toxic-appearing or diaphoretic.  HENT:     Head: Normocephalic.     Right Ear: Tympanic membrane, ear canal and external ear normal.     Left Ear: Tympanic membrane, ear canal and external ear normal.     Nose: Nose normal. No congestion or rhinorrhea.     Mouth/Throat:     Mouth: Mucous membranes are moist.  Eyes:     General:        Right eye: No discharge.        Left eye: No discharge.     Extraocular Movements: Extraocular movements intact.     Conjunctiva/sclera: Conjunctivae normal.     Pupils: Pupils are equal,  round, and reactive to light.  Cardiovascular:     Rate and Rhythm: Normal rate and regular rhythm.     Heart sounds: No murmur heard. Pulmonary:     Effort: Pulmonary effort  is normal. No respiratory distress.     Breath sounds: Normal breath sounds. No wheezing, rhonchi or rales.  Abdominal:     General: Abdomen is flat. Bowel sounds are normal. There is no distension.     Palpations: Abdomen is soft.     Tenderness: There is no abdominal tenderness. There is no guarding.  Musculoskeletal:     Cervical back: Normal range of motion and neck supple.  Skin:    General: Skin is warm and dry.     Capillary Refill: Capillary refill takes less than 2 seconds.  Neurological:     General: No focal deficit present.     Mental Status: He is alert and oriented to person, place, and time.     Cranial Nerves: No cranial nerve deficit.     Motor: No weakness.     Deep Tendon Reflexes: Reflexes normal.  Psychiatric:        Mood and Affect: Mood normal.        Behavior: Behavior normal.        Thought Content: Thought content normal.        Judgment: Judgment normal.     Results for orders placed or performed in visit on 06/15/22  Comprehensive metabolic panel  Result Value Ref Range   Glucose 93 70 - 99 mg/dL   BUN 9 6 - 24 mg/dL   Creatinine, Ser 4.09 0.76 - 1.27 mg/dL   eGFR 811 >91 YN/WGN/5.62   BUN/Creatinine Ratio 11 9 - 20   Sodium 141 134 - 144 mmol/L   Potassium 4.1 3.5 - 5.2 mmol/L   Chloride 100 96 - 106 mmol/L   CO2 20 20 - 29 mmol/L   Calcium 9.2 8.7 - 10.2 mg/dL   Total Protein 6.7 6.0 - 8.5 g/dL   Albumin 4.3 3.8 - 4.9 g/dL   Globulin, Total 2.4 1.5 - 4.5 g/dL   Albumin/Globulin Ratio 1.8 1.2 - 2.2   Bilirubin Total 0.3 0.0 - 1.2 mg/dL   Alkaline Phosphatase 82 44 - 121 IU/L   AST 34 0 - 40 IU/L   ALT 35 0 - 44 IU/L  Lipid Panel w/o Chol/HDL Ratio  Result Value Ref Range   Cholesterol, Total 159 100 - 199 mg/dL   Triglycerides 130 0 - 149 mg/dL   HDL 67 >86  mg/dL   VLDL Cholesterol Cal 18 5 - 40 mg/dL   LDL Chol Calc (NIH) 74 0 - 99 mg/dL      Assessment & Plan:   Problem List Items Addressed This Visit       Cardiovascular and Mediastinum   Hypertension    Chronic. Not well controlled.  Will increase Benazepril to 20mg  daily.  Labs ordered at visit today.  Follow up in 6 months.  Call sooner if concerns arise.       Relevant Medications   atorvastatin (LIPITOR) 40 MG tablet   benazepril (LOTENSIN) 20 MG tablet   Other Relevant Orders   Comprehensive metabolic panel     Other   Hyperlipidemia    Chronic.  Controlled.  Continue with current medication regimen of Atorvastatin 40mg .  Refills sent today.  Labs ordered today.  Return to clinic in 6 months for reevaluation.  Call sooner if concerns arise.        Relevant Medications   atorvastatin (LIPITOR) 40 MG tablet   benazepril (LOTENSIN) 20 MG tablet   Other Relevant Orders   Lipid panel   Other Visit Diagnoses  Annual physical exam    -  Primary   Relevant Orders   TSH   PSA   Lipid panel   CBC with Differential/Platelet   Comprehensive metabolic panel   Urinalysis, Routine w reflex microscopic        Discussed aspirin prophylaxis for myocardial infarction prevention and decision was made to continue ASA  LABORATORY TESTING:  Health maintenance labs ordered today as discussed above.   The natural history of prostate cancer and ongoing controversy regarding screening and potential treatment outcomes of prostate cancer has been discussed with the patient. The meaning of a false positive PSA and a false negative PSA has been discussed. He indicates understanding of the limitations of this screening test and wishes to proceed with screening PSA testing.   IMMUNIZATIONS:   - Tdap: Tetanus vaccination status reviewed: last tetanus booster within 10 years. - Influenza: Postponed to flu season - Pneumovax: Not applicable - Prevnar: Not applicable - COVID: Not  applicable - HPV: Not applicable - Shingrix vaccine:  Discussed at visit today  SCREENING: - Colonoscopy: Up to date  Discussed with patient purpose of the colonoscopy is to detect colon cancer at curable precancerous or early stages   - AAA Screening: Not applicable  -Hearing Test: Not applicable  -Spirometry: Not applicable   PATIENT COUNSELING:    Sexuality: Discussed sexually transmitted diseases, partner selection, use of condoms, avoidance of unintended pregnancy  and contraceptive alternatives.   Advised to avoid cigarette smoking.  I discussed with the patient that most people either abstain from alcohol or drink within safe limits (<=14/week and <=4 drinks/occasion for males, <=7/weeks and <= 3 drinks/occasion for females) and that the risk for alcohol disorders and other health effects rises proportionally with the number of drinks per week and how often a drinker exceeds daily limits.  Discussed cessation/primary prevention of drug use and availability of treatment for abuse.   Diet: Encouraged to adjust caloric intake to maintain  or achieve ideal body weight, to reduce intake of dietary saturated fat and total fat, to limit sodium intake by avoiding high sodium foods and not adding table salt, and to maintain adequate dietary potassium and calcium preferably from fresh fruits, vegetables, and low-fat dairy products.    stressed the importance of regular exercise  Injury prevention: Discussed safety belts, safety helmets, smoke detector, smoking near bedding or upholstery.   Dental health: Discussed importance of regular tooth brushing, flossing, and dental visits.   Follow up plan: NEXT PREVENTATIVE PHYSICAL DUE IN 1 YEAR. Return in about 6 months (around 08/25/2023) for HTN, HLD, DM2 FU.

## 2023-02-22 NOTE — Assessment & Plan Note (Signed)
Chronic.  Controlled.  Continue with current medication regimen of Atorvastatin 40mg.  Refills sent today.  Labs ordered today.  Return to clinic in 6 months for reevaluation.  Call sooner if concerns arise.   

## 2023-02-22 NOTE — Assessment & Plan Note (Signed)
Chronic. Not well controlled.  Will increase Benazepril to 20mg  daily.  Labs ordered at visit today.  Follow up in 6 months.  Call sooner if concerns arise.

## 2023-02-23 ENCOUNTER — Other Ambulatory Visit: Payer: Self-pay | Admitting: Nurse Practitioner

## 2023-02-23 DIAGNOSIS — I10 Essential (primary) hypertension: Secondary | ICD-10-CM

## 2023-02-23 LAB — COMPREHENSIVE METABOLIC PANEL
ALT: 37 IU/L (ref 0–44)
AST: 46 IU/L — ABNORMAL HIGH (ref 0–40)
Albumin/Globulin Ratio: 1.9 (ref 1.2–2.2)
Albumin: 4.5 g/dL (ref 3.8–4.9)
Alkaline Phosphatase: 86 IU/L (ref 44–121)
BUN/Creatinine Ratio: 7 — ABNORMAL LOW (ref 9–20)
BUN: 6 mg/dL (ref 6–24)
Bilirubin Total: 0.5 mg/dL (ref 0.0–1.2)
CO2: 23 mmol/L (ref 20–29)
Calcium: 8.7 mg/dL (ref 8.7–10.2)
Chloride: 94 mmol/L — ABNORMAL LOW (ref 96–106)
Creatinine, Ser: 0.87 mg/dL (ref 0.76–1.27)
Globulin, Total: 2.4 g/dL (ref 1.5–4.5)
Glucose: 88 mg/dL (ref 70–99)
Potassium: 4.2 mmol/L (ref 3.5–5.2)
Sodium: 132 mmol/L — ABNORMAL LOW (ref 134–144)
Total Protein: 6.9 g/dL (ref 6.0–8.5)
eGFR: 103 mL/min/{1.73_m2} (ref >59)

## 2023-02-23 LAB — CBC WITH DIFFERENTIAL/PLATELET
Basophils Absolute: 0.1 10*3/uL (ref 0.0–0.2)
Basos: 1 %
EOS (ABSOLUTE): 0.1 10*3/uL (ref 0.0–0.4)
Eos: 1 %
Hematocrit: 47.1 % (ref 37.5–51.0)
Hemoglobin: 15.6 g/dL (ref 13.0–17.7)
Immature Grans (Abs): 0 10*3/uL (ref 0.0–0.1)
Immature Granulocytes: 0 %
Lymphocytes Absolute: 0.6 10*3/uL — ABNORMAL LOW (ref 0.7–3.1)
Lymphs: 9 %
MCH: 33.3 pg — ABNORMAL HIGH (ref 26.6–33.0)
MCHC: 33.1 g/dL (ref 31.5–35.7)
MCV: 100 fL — ABNORMAL HIGH (ref 79–97)
Monocytes Absolute: 0.6 10*3/uL (ref 0.1–0.9)
Monocytes: 9 %
Neutrophils Absolute: 5.4 10*3/uL (ref 1.4–7.0)
Neutrophils: 80 %
Platelets: 169 10*3/uL (ref 150–450)
RBC: 4.69 x10E6/uL (ref 4.14–5.80)
RDW: 13.2 % (ref 11.6–15.4)
WBC: 6.7 10*3/uL (ref 3.4–10.8)

## 2023-02-23 LAB — PSA: Prostate Specific Ag, Serum: 1.7 ng/mL (ref 0.0–4.0)

## 2023-02-23 LAB — TSH: TSH: 1.77 u[IU]/mL (ref 0.450–4.500)

## 2023-02-23 LAB — LIPID PANEL
Chol/HDL Ratio: 1.8 ratio (ref 0.0–5.0)
Cholesterol, Total: 173 mg/dL (ref 100–199)
HDL: 98 mg/dL (ref >39)
LDL Chol Calc (NIH): 62 mg/dL (ref 0–99)
Triglycerides: 67 mg/dL (ref 0–149)
VLDL Cholesterol Cal: 13 mg/dL (ref 5–40)

## 2023-02-23 MED ORDER — BENAZEPRIL HCL 20 MG PO TABS
20.0000 mg | ORAL_TABLET | Freq: Every day | ORAL | 1 refills | Status: DC
Start: 2023-02-23 — End: 2023-09-05

## 2023-02-23 NOTE — Telephone Encounter (Signed)
Medication Refill - Medication: benazepril (LOTENSIN) 20 MG tablet   Has the patient contacted their pharmacy? Yes.   Pharmacy is stating that they did not get the refill request. It is showing in the system it was sent and received by pharmacy 02/22/23 at 9am.  Pt is requesting medication to be resent to the pharmacy.    Preferred Pharmacy (with phone number or street name):  [facility] - [place], [place] - [address] Phone: [phone]  Fax: [phone]     Has the patient been seen for an appointment in the last year OR does the patient have an upcoming appointment? Yes.    Agent: Please be advised that RX refills may take up to 3 business days. We ask that you follow-up with your pharmacy.

## 2023-02-23 NOTE — Telephone Encounter (Signed)
Requested Prescriptions  Pending Prescriptions Disp Refills   benazepril (LOTENSIN) 20 MG tablet 90 tablet 1    Sig: Take 1 tablet (20 mg total) by mouth daily. TAKE ONE TABLET BY MOUTH EVERY DAY. NEEDAPPT FOR FUTURE REFILLS     Cardiovascular:  ACE Inhibitors Failed - 02/23/2023 10:26 AM      Failed - Last BP in normal range    BP Readings from Last 1 Encounters:  02/22/23 (!) 146/77         Passed - Cr in normal range and within 180 days    Creatinine, Ser  Date Value Ref Range Status  02/22/2023 0.87 0.76 - 1.27 mg/dL Final         Passed - K in normal range and within 180 days    Potassium  Date Value Ref Range Status  02/22/2023 4.2 3.5 - 5.2 mmol/L Final         Passed - Patient is not pregnant      Passed - Valid encounter within last 6 months    Recent Outpatient Visits           Yesterday Annual physical exam   [place] [facility] Larae Grooms, NP   8 months ago Primary hypertension   [place] [facility] [place], Megan P, DO   1 year ago Routine general medical examination at a health care facility   [place] [facility] Gerre Scull, NP   2 years ago Need for hepatitis C screening test   [place] [facility] Gabriel Cirri, NP   3 years ago PE (physical exam), annual   [place] [facility] Steele Sizer, MD       Future Appointments             In 6 months Larae Grooms, NP [place] [facility]

## 2023-02-23 NOTE — Progress Notes (Signed)
HI Cameron Nelson. It was nice to meet you yesterday.  Your lab work looks good.  No concerns at this time. Continue with your current medication regimen.  Follow up as discussed.  Please let me know if you have any questions.

## 2023-05-23 ENCOUNTER — Other Ambulatory Visit: Payer: Self-pay | Admitting: Nurse Practitioner

## 2023-05-23 DIAGNOSIS — E7800 Pure hypercholesterolemia, unspecified: Secondary | ICD-10-CM

## 2023-05-24 NOTE — Telephone Encounter (Signed)
Unable to refill per protocol, Rx request is too soon. Last refill 02/22/23 for 90 and 1 refill.  Requested Prescriptions  Pending Prescriptions Disp Refills   atorvastatin (LIPITOR) 40 MG tablet [Pharmacy Med Name: ATORVASTATIN CALCIUM 40 MG TAB] 90 tablet 1    Sig: TAKE ONE TABLET BY MOUTH EVERY DAY     Cardiovascular:  Antilipid - Statins Failed - 05/23/2023  9:38 AM      Failed - Lipid Panel in normal range within the last 12 months    Cholesterol, Total  Date Value Ref Range Status  02/22/2023 173 100 - 199 mg/dL Final   Cholesterol Piccolo, Waived  Date Value Ref Range Status  11/13/2018 175 <200 mg/dL Final    Comment:                            Desirable                <200                         Borderline High      200- 239                         High                     >239    LDL Chol Calc (NIH)  Date Value Ref Range Status  02/22/2023 62 0 - 99 mg/dL Final   HDL  Date Value Ref Range Status  02/22/2023 98 >39 mg/dL Final   Triglycerides  Date Value Ref Range Status  02/22/2023 67 0 - 149 mg/dL Final   Triglycerides Piccolo,Waived  Date Value Ref Range Status  11/13/2018 391 (H) <150 mg/dL Final    Comment:                            Normal                   <150                         Borderline High     150 - 199                         High                200 - 499                         Very High                >499          Passed - Patient is not pregnant      Passed - Valid encounter within last 12 months    Recent Outpatient Visits           3 months ago Annual physical exam   [place] [facility] Larae Grooms, NP   11 months ago Primary hypertension   [place] [facility] [place], Megan P, DO   1 year ago Routine general medical examination at a health care facility   [facility] [facility], Lauren A, NP   2 years ago Need for hepatitis  C screening test   [place] [facility] Gabriel Cirri, NP   4 years ago PE (physical exam), annual   [place] [facility] Steele Sizer, MD       Future Appointments             In 3 months Larae Grooms, NP [place] [facility], [facility]

## 2023-08-29 NOTE — Progress Notes (Deleted)
There were no vitals taken for this visit.   Subjective:    Patient ID: Cameron Nelson, male    DOB: [birth year], 57 y.o.   MRN: [id number]  HPI: Cameron Nelson is a 57 y.o. male  No chief complaint on file.  HYPERTENSION / HYPERLIPIDEMIA Satisfied with current treatment? {Blank single:19197::"yes","no"} Duration of hypertension: {Blank single:19197::"chronic","months","years"} BP monitoring frequency: {Blank single:19197::"not checking","rarely","daily","weekly","monthly","a few times a day","a few times a week","a few times a month"} BP range:  BP medication side effects: {Blank single:19197::"yes","no"} Past BP meds: {Blank multiple:19196::"none","amlodipine","amlodipine/benazepril","atenolol","benazepril","benazepril/HCTZ","bisoprolol (bystolic)","carvedilol","chlorthalidone","clonidine","diltiazem","exforge HCT","HCTZ","irbesartan (avapro)","labetalol","lisinopril","lisinopril-HCTZ","losartan (cozaar)","methyldopa","nifedipine","olmesartan (benicar)","olmesartan-HCTZ","quinapril","ramipril","spironalactone","tekturna","valsartan","valsartan-HCTZ","verapamil"} Duration of hyperlipidemia: {Blank single:19197::"chronic","months","years"} Cholesterol medication side effects: {Blank single:19197::"yes","no"} Cholesterol supplements: {Blank multiple:19196::"none","fish oil","niacin","red yeast rice"} Past cholesterol medications: {Blank multiple:19196::"none","atorvastain (lipitor)","lovastatin (mevacor)","pravastatin (pravachol)","rosuvastatin (crestor)","simvastatin (zocor)","vytorin","fenofibrate (tricor)","gemfibrozil","ezetimide (zetia)","niaspan","lovaza"} Medication compliance: {Blank single:19197::"excellent compliance","good compliance","fair compliance","poor compliance"} Aspirin: {Blank single:19197::"yes","no"} Recent stressors: {Blank single:19197::"yes","no"} Recurrent headaches: {Blank single:19197::"yes","no"} Visual changes: {Blank single:19197::"yes","no"} Palpitations:  {Blank single:19197::"yes","no"} Dyspnea: {Blank single:19197::"yes","no"} Chest pain: {Blank single:19197::"yes","no"} Lower extremity edema: {Blank single:19197::"yes","no"} Dizzy/lightheaded: {Blank single:19197::"yes","no"}  Relevant past medical, surgical, family and social history reviewed and updated as indicated. Interim medical history since our last visit reviewed. Allergies and medications reviewed and updated.  Review of Systems  Per HPI unless specifically indicated above     Objective:    There were no vitals taken for this visit.  Wt Readings from Last 3 Encounters:  02/22/23 182 lb 3.2 oz (82.6 kg)  06/15/22 198 lb 11.2 oz (90.1 kg)  09/21/21 197 lb (89.4 kg)    Physical Exam  Results for orders placed or performed in visit on 02/22/23  Microscopic Examination   Urine  Result Value Ref Range   WBC, UA None seen 0 - 5 /hpf   RBC, Urine 0-2 0 - 2 /hpf   Epithelial Cells (non renal) None seen 0 - 10 /hpf   Bacteria, UA None seen None seen/Few  TSH  Result Value Ref Range   TSH 1.770 0.450 - 4.500 uIU/mL  PSA  Result Value Ref Range   Prostate Specific Ag, Serum 1.7 0.0 - 4.0 ng/mL  Lipid panel  Result Value Ref Range   Cholesterol, Total 173 100 - 199 mg/dL   Triglycerides 67 0 - 149 mg/dL   HDL 98 >78 mg/dL   VLDL Cholesterol Cal 13 5 - 40 mg/dL   LDL Chol Calc (NIH) 62 0 - 99 mg/dL   Chol/HDL Ratio 1.8 0.0 - 5.0 ratio  CBC with Differential/Platelet  Result Value Ref Range   WBC 6.7 3.4 - 10.8 x10E3/uL   RBC 4.69 4.14 - 5.80 x10E6/uL   Hemoglobin 15.6 13.0 - 17.7 g/dL   Hematocrit 29.5 62.1 - 51.0 %   MCV 100 (H) 79 - 97 fL   MCH 33.3 (H) 26.6 - 33.0 pg   MCHC 33.1 31.5 - 35.7 g/dL   RDW 30.8 65.7 - 84.6 %   Platelets 169 150 - 450 x10E3/uL   Neutrophils 80 Not Estab. %   Lymphs 9 Not Estab. %   Monocytes 9 Not Estab. %   Eos 1 Not Estab. %   Basos 1 Not Estab. %   Neutrophils Absolute 5.4 1.4 - 7.0 x10E3/uL   Lymphocytes Absolute 0.6 (L)  0.7 - 3.1 x10E3/uL   Monocytes Absolute 0.6 0.1 - 0.9 x10E3/uL   EOS (ABSOLUTE) 0.1 0.0 - 0.4 x10E3/uL   Basophils Absolute 0.1 0.0 - 0.2 x10E3/uL   Immature Granulocytes 0 Not Estab. %   Immature Grans (Abs) 0.0 0.0 - 0.1 x10E3/uL  Comprehensive metabolic  panel  Result Value Ref Range   Glucose 88 70 - 99 mg/dL   BUN 6 6 - 24 mg/dL   Creatinine, Ser 1.61 0.76 - 1.27 mg/dL   eGFR 096 >04 VW/UJW/1.19   BUN/Creatinine Ratio 7 (L) 9 - 20   Sodium 132 (L) 134 - 144 mmol/L   Potassium 4.2 3.5 - 5.2 mmol/L   Chloride 94 (L) 96 - 106 mmol/L   CO2 23 20 - 29 mmol/L   Calcium 8.7 8.7 - 10.2 mg/dL   Total Protein 6.9 6.0 - 8.5 g/dL   Albumin 4.5 3.8 - 4.9 g/dL   Globulin, Total 2.4 1.5 - 4.5 g/dL   Albumin/Globulin Ratio 1.9 1.2 - 2.2   Bilirubin Total 0.5 0.0 - 1.2 mg/dL   Alkaline Phosphatase 86 44 - 121 IU/L   AST 46 (H) 0 - 40 IU/L   ALT 37 0 - 44 IU/L  Urinalysis, Routine w reflex microscopic  Result Value Ref Range   Specific Gravity, UA <1.005 (L) 1.005 - 1.030   pH, UA 5.5 5.0 - 7.5   Color, UA Yellow Yellow   Appearance Ur Clear Clear   Leukocytes,UA Negative Negative   Protein,UA Negative Negative/Trace   Glucose, UA Negative Negative   Ketones, UA Negative Negative   RBC, UA Trace (A) Negative   Bilirubin, UA Negative Negative   Urobilinogen, Ur 0.2 0.2 - 1.0 mg/dL   Nitrite, UA Negative Negative   Microscopic Examination See below:       Assessment & Plan:   Problem List Items Addressed This Visit       Cardiovascular and Mediastinum   Hypertension - Primary     Other   Hyperlipidemia     Follow up plan: No follow-ups on file.

## 2023-08-30 ENCOUNTER — Ambulatory Visit: Payer: BC Managed Care – PPO | Admitting: Nurse Practitioner

## 2023-08-30 DIAGNOSIS — I10 Essential (primary) hypertension: Secondary | ICD-10-CM

## 2023-08-30 DIAGNOSIS — E7800 Pure hypercholesterolemia, unspecified: Secondary | ICD-10-CM

## 2023-09-05 ENCOUNTER — Other Ambulatory Visit: Payer: Self-pay | Admitting: Nurse Practitioner

## 2023-09-05 ENCOUNTER — Telehealth: Payer: Self-pay | Admitting: Nurse Practitioner

## 2023-09-05 DIAGNOSIS — I10 Essential (primary) hypertension: Secondary | ICD-10-CM

## 2023-09-05 DIAGNOSIS — E7800 Pure hypercholesterolemia, unspecified: Secondary | ICD-10-CM

## 2023-09-05 MED ORDER — BENAZEPRIL HCL 20 MG PO TABS
20.0000 mg | ORAL_TABLET | Freq: Every day | ORAL | 0 refills | Status: DC
Start: 2023-09-05 — End: 2023-11-01

## 2023-09-05 MED ORDER — ATORVASTATIN CALCIUM 40 MG PO TABS
40.0000 mg | ORAL_TABLET | Freq: Every day | ORAL | 0 refills | Status: DC
Start: 2023-09-05 — End: 2023-09-27

## 2023-09-05 NOTE — Telephone Encounter (Signed)
Refill sent to the pharmacy 

## 2023-09-05 NOTE — Telephone Encounter (Signed)
Medication Refill -  Most Recent Primary Care Visit:  Provider: Larae Grooms  Department: [facility]  Visit Type: PHYSICAL  Date: 02/22/2023  Medication:  atorvastatin (LIPITOR) 40 MG tablet and benazepril (LOTENSIN) 20 MG tablet   Has the patient contacted their pharmacy? No   Is this the correct pharmacy for this prescription? Yes If no, delete pharmacy and type the correct one.  This is the patient's preferred pharmacy: [facility] - [place], [place] - [address]esa Chew [place] [address] Phone: [phone] Fax: [phone]  Has the prescription been filled recently? Yes  Is the patient out of the medication? No  Has the patient been seen for an appointment in the last year OR does the patient have an upcoming appointment? Yes  Can we respond through MyChart? Yes  Agent: Please be advised that Rx refills may take up to 3 business days. We ask that you follow-up with your pharmacy.  Patient needs enough meds to get him to his next appt

## 2023-09-06 NOTE — Telephone Encounter (Signed)
Requested Prescriptions  Refused Prescriptions Disp Refills   atorvastatin (LIPITOR) 40 MG tablet [Pharmacy Med Name: ATORVASTATIN CALCIUM 40 MG TAB] 90 tablet     Sig: TAKE ONE TABLET BY MOUTH EVERY DAY     Cardiovascular:  Antilipid - Statins Failed - 09/05/2023  2:58 PM      Failed - Lipid Panel in normal range within the last 12 months    Cholesterol, Total  Date Value Ref Range Status  02/22/2023 173 100 - 199 mg/dL Final   Cholesterol Piccolo, Waived  Date Value Ref Range Status  11/13/2018 175 <200 mg/dL Final    Comment:                            Desirable                <200                         Borderline High      200- 239                         High                     >239    LDL Chol Calc (NIH)  Date Value Ref Range Status  02/22/2023 62 0 - 99 mg/dL Final   HDL  Date Value Ref Range Status  02/22/2023 98 >39 mg/dL Final   Triglycerides  Date Value Ref Range Status  02/22/2023 67 0 - 149 mg/dL Final   Triglycerides Piccolo,Waived  Date Value Ref Range Status  11/13/2018 391 (H) <150 mg/dL Final    Comment:                            Normal                   <150                         Borderline High     150 - 199                         High                200 - 499                         Very High                >499          Passed - Patient is not pregnant      Passed - Valid encounter within last 12 months    Recent Outpatient Visits           6 months ago Annual physical exam   [place] [facility] Larae Grooms, NP   1 year ago Primary hypertension   [place] [facility] [place], [place] P, DO   2 years ago Routine general medical examination at a health care facility   [place] [facility] Gerre Scull, NP   3 years ago Need for hepatitis C screening test   [place] [facility] Gabriel Cirri, NP   4 years ago PE (  physical exam), annual   [facility] [facility] Steele Sizer, MD       Future Appointments             In 1 week Larae Grooms, NP [facility]

## 2023-09-13 NOTE — Progress Notes (Signed)
BP 125/74 Comment: home blood pressure reading  Pulse 74   Temp 98.1 F (36.7 C) (Oral)   Ht 6\' 1"  (1.854 m)   Wt 183 lb 9.6 oz (83.3 kg)   SpO2 98%   BMI 24.22 kg/m    Subjective:    Patient ID: Cameron Nelson, male    DOB: 01-15-1966, 57 y.o.   MRN: [id number]  HPI: Cameron Nelson is a 57 y.o. male  Chief Complaint  Patient presents with   6 month follow up    Hyperlipidemia   Hypertension   HYPERTENSION / HYPERLIPIDEMIA Satisfied with current treatment? yes Duration of hypertension: years BP monitoring frequency: daily BP range: 128/73 BP medication side effects: no Past BP meds: benazepril Duration of hyperlipidemia: years Cholesterol medication side effects: no Cholesterol supplements: none Past cholesterol medications: atorvastain (lipitor) Medication compliance: excellent compliance Aspirin: yes Recent stressors: no Recurrent headaches: no Visual changes: no Palpitations: no Dyspnea: no Chest pain: no Lower extremity edema: no Dizzy/lightheaded: no  Relevant past medical, surgical, family and social history reviewed and updated as indicated. Interim medical history since our last visit reviewed. Allergies and medications reviewed and updated.  Review of Systems  Eyes:  Negative for visual disturbance.  Respiratory:  Negative for chest tightness and shortness of breath.   Cardiovascular:  Negative for chest pain, palpitations and leg swelling.  Neurological:  Negative for dizziness, light-headedness and headaches.    Per HPI unless specifically indicated above     Objective:    BP 125/74 Comment: home blood pressure reading  Pulse 74   Temp 98.1 F (36.7 C) (Oral)   Ht 6\' 1"  (1.854 m)   Wt 183 lb 9.6 oz (83.3 kg)   SpO2 98%   BMI 24.22 kg/m   Wt Readings from Last 3 Encounters:  09/14/23 183 lb 9.6 oz (83.3 kg)  02/22/23 182 lb 3.2 oz (82.6 kg)  06/15/22 198 lb 11.2 oz (90.1 kg)    Physical Exam Vitals and nursing note reviewed.   Constitutional:      General: He is not in acute distress.    Appearance: Normal appearance. He is not ill-appearing, toxic-appearing or diaphoretic.  HENT:     Head: Normocephalic.     Right Ear: External ear normal.     Left Ear: External ear normal.     Nose: Nose normal. No congestion or rhinorrhea.     Mouth/Throat:     Mouth: Mucous membranes are moist.  Eyes:     General:        Right eye: No discharge.        Left eye: No discharge.     Extraocular Movements: Extraocular movements intact.     Conjunctiva/sclera: Conjunctivae normal.     Pupils: Pupils are equal, round, and reactive to light.  Cardiovascular:     Rate and Rhythm: Normal rate and regular rhythm.     Heart sounds: No murmur heard. Pulmonary:     Effort: Pulmonary effort is normal. No respiratory distress.     Breath sounds: Normal breath sounds. No wheezing, rhonchi or rales.  Abdominal:     General: Abdomen is flat. Bowel sounds are normal.  Musculoskeletal:     Cervical back: Normal range of motion and neck supple.  Skin:    General: Skin is warm and dry.     Capillary Refill: Capillary refill takes less than 2 seconds.  Neurological:     General: No focal deficit present.     Mental  Status: He is alert and oriented to person, place, and time.  Psychiatric:        Mood and Affect: Mood normal.        Behavior: Behavior normal.        Thought Content: Thought content normal.        Judgment: Judgment normal.     Results for orders placed or performed in visit on 02/22/23  Microscopic Examination   Urine  Result Value Ref Range   WBC, UA None seen 0 - 5 /hpf   RBC, Urine 0-2 0 - 2 /hpf   Epithelial Cells (non renal) None seen 0 - 10 /hpf   Bacteria, UA None seen None seen/Few  TSH  Result Value Ref Range   TSH 1.770 0.450 - 4.500 uIU/mL  PSA  Result Value Ref Range   Prostate Specific Ag, Serum 1.7 0.0 - 4.0 ng/mL  Lipid panel  Result Value Ref Range   Cholesterol, Total 173 100 - 199  mg/dL   Triglycerides 67 0 - 149 mg/dL   HDL 98 >71 mg/dL   VLDL Cholesterol Cal 13 5 - 40 mg/dL   LDL Chol Calc (NIH) 62 0 - 99 mg/dL   Chol/HDL Ratio 1.8 0.0 - 5.0 ratio  CBC with Differential/Platelet  Result Value Ref Range   WBC 6.7 3.4 - 10.8 x10E3/uL   RBC 4.69 4.14 - 5.80 x10E6/uL   Hemoglobin 15.6 13.0 - 17.7 g/dL   Hematocrit 06.2 69.4 - 51.0 %   MCV 100 (H) 79 - 97 fL   MCH 33.3 (H) 26.6 - 33.0 pg   MCHC 33.1 31.5 - 35.7 g/dL   RDW 85.4 62.7 - 03.5 %   Platelets 169 150 - 450 x10E3/uL   Neutrophils 80 Not Estab. %   Lymphs 9 Not Estab. %   Monocytes 9 Not Estab. %   Eos 1 Not Estab. %   Basos 1 Not Estab. %   Neutrophils Absolute 5.4 1.4 - 7.0 x10E3/uL   Lymphocytes Absolute 0.6 (L) 0.7 - 3.1 x10E3/uL   Monocytes Absolute 0.6 0.1 - 0.9 x10E3/uL   EOS (ABSOLUTE) 0.1 0.0 - 0.4 x10E3/uL   Basophils Absolute 0.1 0.0 - 0.2 x10E3/uL   Immature Granulocytes 0 Not Estab. %   Immature Grans (Abs) 0.0 0.0 - 0.1 x10E3/uL  Comprehensive metabolic panel  Result Value Ref Range   Glucose 88 70 - 99 mg/dL   BUN 6 6 - 24 mg/dL   Creatinine, Ser 0.09 0.76 - 1.27 mg/dL   eGFR 381 >82 XH/BZJ/6.96   BUN/Creatinine Ratio 7 (L) 9 - 20   Sodium 132 (L) 134 - 144 mmol/L   Potassium 4.2 3.5 - 5.2 mmol/L   Chloride 94 (L) 96 - 106 mmol/L   CO2 23 20 - 29 mmol/L   Calcium 8.7 8.7 - 10.2 mg/dL   Total Protein 6.9 6.0 - 8.5 g/dL   Albumin 4.5 3.8 - 4.9 g/dL   Globulin, Total 2.4 1.5 - 4.5 g/dL   Albumin/Globulin Ratio 1.9 1.2 - 2.2   Bilirubin Total 0.5 0.0 - 1.2 mg/dL   Alkaline Phosphatase 86 44 - 121 IU/L   AST 46 (H) 0 - 40 IU/L   ALT 37 0 - 44 IU/L  Urinalysis, Routine w reflex microscopic  Result Value Ref Range   Specific Gravity, UA <1.005 (L) 1.005 - 1.030   pH, UA 5.5 5.0 - 7.5   Color, UA Yellow Yellow   Appearance Ur Clear Clear  Leukocytes,UA Negative Negative   Protein,UA Negative Negative/Trace   Glucose, UA Negative Negative   Ketones, UA Negative Negative    RBC, UA Trace (A) Negative   Bilirubin, UA Negative Negative   Urobilinogen, Ur 0.2 0.2 - 1.0 mg/dL   Nitrite, UA Negative Negative   Microscopic Examination See below:       Assessment & Plan:   Problem List Items Addressed This Visit       Cardiovascular and Mediastinum   Hypertension    Chronic. Controlled.  Continue Benazepril to 20mg  daily.  Continue to check blood pressures at home and bring log to next visit.  Labs ordered at visit today.  Follow up in 6 months.  Call sooner if concerns arise.       Relevant Orders   Comp Met (CMET)     Other   Hyperlipidemia - Primary    Chronic.  Controlled.  Continue with current medication regimen of Atorvastatin 40mg .  Labs ordered today.  Return to clinic in 6 months for reevaluation.  Call sooner if concerns arise.       Relevant Orders   Lipid Profile   Other Visit Diagnoses     Screening for colon cancer       Relevant Orders   Cologuard        Follow up plan: Return in about 6 months (around 03/13/2024) for Physical and Fasting labs.

## 2023-09-14 ENCOUNTER — Ambulatory Visit: Payer: BC Managed Care – PPO | Admitting: Nurse Practitioner

## 2023-09-14 ENCOUNTER — Encounter: Payer: Self-pay | Admitting: Nurse Practitioner

## 2023-09-14 VITALS — BP 125/74 | HR 74 | Temp 98.1°F | Ht 73.0 in | Wt 183.6 lb

## 2023-09-14 DIAGNOSIS — E7800 Pure hypercholesterolemia, unspecified: Secondary | ICD-10-CM

## 2023-09-14 DIAGNOSIS — I10 Essential (primary) hypertension: Secondary | ICD-10-CM

## 2023-09-14 DIAGNOSIS — Z1211 Encounter for screening for malignant neoplasm of colon: Secondary | ICD-10-CM | POA: Diagnosis not present

## 2023-09-14 NOTE — Assessment & Plan Note (Signed)
Chronic.  Controlled.  Continue with current medication regimen of Atorvastatin 40mg .  Labs ordered today.  Return to clinic in 6 months for reevaluation.  Call sooner if concerns arise.

## 2023-09-14 NOTE — Assessment & Plan Note (Signed)
Chronic. Controlled.  Continue Benazepril to 20mg  daily.  Continue to check blood pressures at home and bring log to next visit.  Labs ordered at visit today.  Follow up in 6 months.  Call sooner if concerns arise.

## 2023-09-15 LAB — COMPREHENSIVE METABOLIC PANEL
ALT: 22 [IU]/L (ref 0–44)
AST: 30 [IU]/L (ref 0–40)
Albumin: 4.1 g/dL (ref 3.8–4.9)
Alkaline Phosphatase: 67 [IU]/L (ref 44–121)
BUN/Creatinine Ratio: 7 — ABNORMAL LOW (ref 9–20)
BUN: 5 mg/dL — ABNORMAL LOW (ref 6–24)
Bilirubin Total: 0.2 mg/dL (ref 0.0–1.2)
CO2: 25 mmol/L (ref 20–29)
Calcium: 9.1 mg/dL (ref 8.7–10.2)
Chloride: 100 mmol/L (ref 96–106)
Creatinine, Ser: 0.75 mg/dL — ABNORMAL LOW (ref 0.76–1.27)
Globulin, Total: 2.6 g/dL (ref 1.5–4.5)
Glucose: 77 mg/dL (ref 70–99)
Potassium: 4.5 mmol/L (ref 3.5–5.2)
Sodium: 139 mmol/L (ref 134–144)
Total Protein: 6.7 g/dL (ref 6.0–8.5)
eGFR: 107 mL/min/{1.73_m2} (ref >59)

## 2023-09-15 LAB — LIPID PANEL
Chol/HDL Ratio: 1.8 ratio (ref 0.0–5.0)
Cholesterol, Total: 167 mg/dL (ref 100–199)
HDL: 91 mg/dL (ref >39)
LDL Chol Calc (NIH): 55 mg/dL (ref 0–99)
Triglycerides: 128 mg/dL (ref 0–149)
VLDL Cholesterol Cal: 21 mg/dL (ref 5–40)

## 2023-09-25 ENCOUNTER — Other Ambulatory Visit: Payer: Self-pay | Admitting: Nurse Practitioner

## 2023-09-25 DIAGNOSIS — E7800 Pure hypercholesterolemia, unspecified: Secondary | ICD-10-CM

## 2023-09-27 NOTE — Telephone Encounter (Signed)
Requested Prescriptions  Pending Prescriptions Disp Refills   atorvastatin (LIPITOR) 40 MG tablet [Pharmacy Med Name: ATORVASTATIN CALCIUM 40 MG TAB] 30 tablet 0    Sig: TAKE 1 TABLET BY MOUTH DAILY     Cardiovascular:  Antilipid - Statins Failed - 09/25/2023 10:30 AM      Failed - Lipid Panel in normal range within the last 12 months    Cholesterol, Total  Date Value Ref Range Status  09/14/2023 167 100 - 199 mg/dL Final   Cholesterol Piccolo, Waived  Date Value Ref Range Status  11/13/2018 175 <200 mg/dL Final    Comment:                            Desirable                <200                         Borderline High      200- 239                         High                     >239    LDL Chol Calc (NIH)  Date Value Ref Range Status  09/14/2023 55 0 - 99 mg/dL Final   HDL  Date Value Ref Range Status  09/14/2023 91 >39 mg/dL Final   Triglycerides  Date Value Ref Range Status  09/14/2023 128 0 - 149 mg/dL Final   Triglycerides Piccolo,Waived  Date Value Ref Range Status  11/13/2018 391 (H) <150 mg/dL Final    Comment:                            Normal                   <150                         Borderline High     150 - 199                         High                200 - 499                         Very High                >499          Passed - Patient is not pregnant      Passed - Valid encounter within last 12 months    Recent Outpatient Visits           1 week ago Pure hypercholesterolemia   [place] [facility] Larae Grooms, NP   7 months ago Annual physical exam   [facility] Larae Grooms, NP   1 year ago Primary hypertension    [facility] [place], [place] P, DO   2 years ago Routine general medical examination at a health care facility   [facility], Lauren A, NP   3 years ago Need for hepatitis C screening test  [place] Gabriel Cirri, NP       Future Appointments             In 5 months Larae Grooms, NP [facility], [facility]

## 2023-10-02 ENCOUNTER — Other Ambulatory Visit: Payer: Self-pay | Admitting: Nurse Practitioner

## 2023-10-02 DIAGNOSIS — E7800 Pure hypercholesterolemia, unspecified: Secondary | ICD-10-CM

## 2023-10-03 NOTE — Telephone Encounter (Signed)
Requested Prescriptions  Pending Prescriptions Disp Refills   atorvastatin (LIPITOR) 40 MG tablet [Pharmacy Med Name: ATORVASTATIN CALCIUM 40 MG TAB] 30 tablet 0    Sig: TAKE 1 TABLET BY MOUTH DAILY     Cardiovascular:  Antilipid - Statins Failed - 10/02/2023 12:27 PM      Failed - Lipid Panel in normal range within the last 12 months    Cholesterol, Total  Date Value Ref Range Status  09/14/2023 167 100 - 199 mg/dL Final   Cholesterol Piccolo, Waived  Date Value Ref Range Status  11/13/2018 175 <200 mg/dL Final    Comment:                            Desirable                <200                         Borderline High      200- 239                         High                     >239    LDL Chol Calc (NIH)  Date Value Ref Range Status  09/14/2023 55 0 - 99 mg/dL Final   HDL  Date Value Ref Range Status  09/14/2023 91 >39 mg/dL Final   Triglycerides  Date Value Ref Range Status  09/14/2023 128 0 - 149 mg/dL Final   Triglycerides Piccolo,Waived  Date Value Ref Range Status  11/13/2018 391 (H) <150 mg/dL Final    Comment:                            Normal                   <150                         Borderline High     150 - 199                         High                200 - 499                         Very High                >499          Passed - Patient is not pregnant      Passed - Valid encounter within last 12 months    Recent Outpatient Visits           2 weeks ago Pure hypercholesterolemia   [place] [facility] Larae Grooms, NP   7 months ago Annual physical exam   [place] [facility] Larae Grooms, NP   1 year ago Primary hypertension   [place] [facility] [place], [place] P, DO   2 years ago Routine general medical examination at a health care facility   [facility], Lauren A, NP   3 years ago Need for hepatitis C screening test  [facility] Gabriel Cirri, NP       Future Appointments             In 5 months Larae Grooms, NP [facility], [facility]

## 2023-10-31 ENCOUNTER — Other Ambulatory Visit: Payer: Self-pay | Admitting: Nurse Practitioner

## 2023-10-31 DIAGNOSIS — E7800 Pure hypercholesterolemia, unspecified: Secondary | ICD-10-CM

## 2023-11-01 ENCOUNTER — Other Ambulatory Visit: Payer: Self-pay | Admitting: Nurse Practitioner

## 2023-11-01 DIAGNOSIS — I10 Essential (primary) hypertension: Secondary | ICD-10-CM

## 2023-11-01 DIAGNOSIS — E7800 Pure hypercholesterolemia, unspecified: Secondary | ICD-10-CM

## 2023-11-01 MED ORDER — BENAZEPRIL HCL 20 MG PO TABS: 20.0000 mg | ORAL_TABLET | Freq: Every day | ORAL | 0 refills | Status: DC

## 2023-11-01 MED ORDER — ATORVASTATIN CALCIUM 40 MG PO TABS: 40.0000 mg | ORAL_TABLET | Freq: Every day | ORAL | 0 refills | Status: DC

## 2023-11-01 NOTE — Telephone Encounter (Signed)
 Pt called in concerned about not getting medication, because of weather and he is going out of town and won't have med if he doesn't get it today. I let him know this is being worked on.

## 2023-11-01 NOTE — Telephone Encounter (Signed)
 Both medications are due for refill. 6 month f/up visit done in November.

## 2023-11-02 NOTE — Telephone Encounter (Signed)
 Refilled 11/01/23, will refuse this request.  Requested Prescriptions  Pending Prescriptions Disp Refills   atorvastatin  (LIPITOR) 40 MG tablet [Pharmacy Med Name: ATORVASTATIN  CALCIUM  40 MG TAB] 30 tablet 0    Sig: TAKE 1 TABLET BY MOUTH DAILY     Cardiovascular:  Antilipid - Statins Failed - 11/02/2023  6:58 PM      Failed - Lipid Panel in normal range within the last 12 months    Cholesterol, Total  Date Value Ref Range Status  09/14/2023 167 100 - 199 mg/dL Final   Cholesterol Piccolo, Waived  Date Value Ref Range Status  11/13/2018 175 <200 mg/dL Final    Comment:                            Desirable                <200                         Borderline High      200- 239                         High                     >239    LDL Chol Calc (NIH)  Date Value Ref Range Status  09/14/2023 55 0 - 99 mg/dL Final   HDL  Date Value Ref Range Status  09/14/2023 91 >39 mg/dL Final   Triglycerides  Date Value Ref Range Status  09/14/2023 128 0 - 149 mg/dL Final   Triglycerides Piccolo,Waived  Date Value Ref Range Status  11/13/2018 391 (H) <150 mg/dL Final    Comment:                            Normal                   <150                         Borderline High     150 - 199                         High                200 - 499                         Very High                >499          Passed - Patient is not pregnant      Passed - Valid encounter within last 12 months    Recent Outpatient Visits           1 month ago Pure hypercholesterolemia   [place] [facility] Melvin Pao, NP   8 months ago Annual physical exam   [place] [facility] Melvin Pao, NP   1 year ago Primary hypertension   [place] [facility] [place], [place] P, DO   2 years ago Routine general medical examination at a health care facility   [facility] Nedra Tinnie LABOR, NP   3 years  ago Need for  hepatitis C screening test   [place] [facility] Daphane Rosella, NP       Future Appointments             In 4 months Melvin Pao, NP [facility], [facility]

## 2023-12-31 ENCOUNTER — Other Ambulatory Visit: Payer: Self-pay | Admitting: Nurse Practitioner

## 2023-12-31 DIAGNOSIS — I10 Essential (primary) hypertension: Secondary | ICD-10-CM

## 2023-12-31 NOTE — Telephone Encounter (Signed)
 Copied from CRM [id number]. Topic: Clinical - Medication Refill >> Dec 31, 2023  2:22 PM Geroge Baseman wrote: Most Recent Primary Care Visit:  Provider: Larae Grooms  Department: [id number]  Visit Type: OFFICE VISIT  Date: 09/14/2023  Medication: benazepril (LOTENSIN) 20 MG tablet  Has the patient contacted their pharmacy? No - changing pharmacies.   Is this the correct pharmacy for this prescription? Yes If no, delete pharmacy and type the correct one.  This is the patient's preferred pharmacy:   [facility] [address] [place], [place] [address]    Has the prescription been filled recently? No  Is the patient out of the medication? No, 6 LEFT  Has the patient been seen for an appointment in the last year OR does the patient have an upcoming appointment? Yes  Can we respond through MyChart? No  Agent: Please be advised that Rx refills may take up to 3 business days. We ask that you follow-up with your pharmacy.

## 2024-01-01 ENCOUNTER — Other Ambulatory Visit: Payer: Self-pay

## 2024-01-01 DIAGNOSIS — E7800 Pure hypercholesterolemia, unspecified: Secondary | ICD-10-CM

## 2024-01-01 DIAGNOSIS — I10 Essential (primary) hypertension: Secondary | ICD-10-CM

## 2024-01-01 NOTE — Telephone Encounter (Signed)
 Routing to provider. Pharmacy added as requested.

## 2024-01-01 NOTE — Telephone Encounter (Signed)
 Copied from CRM [id number]. Topic: General - Other >> Dec 31, 2023  2:25 PM Geroge Baseman wrote: Reason for CRM: patient would like all prescription to be switched over to [facility] pharmacy on [address], [place] until further notice

## 2024-01-02 MED ORDER — BENAZEPRIL HCL 20 MG PO TABS: 20.0000 mg | ORAL_TABLET | Freq: Every day | ORAL | 0 refills | Status: DC

## 2024-01-02 MED ORDER — ATORVASTATIN CALCIUM 40 MG PO TABS: 40.0000 mg | ORAL_TABLET | Freq: Every day | ORAL | 0 refills | Status: DC

## 2024-03-07 ENCOUNTER — Ambulatory Visit
Admission: RE | Admit: 2024-03-07 | Discharge: 2024-03-07 | Disposition: A | Discharge status: Home / Self Care | Source: Ambulatory Visit | Attending: Family Medicine | Admitting: Family Medicine

## 2024-03-07 ENCOUNTER — Other Ambulatory Visit: Payer: Self-pay | Admitting: Family Medicine

## 2024-03-07 DIAGNOSIS — T1490XA Injury, unspecified, initial encounter: Secondary | ICD-10-CM

## 2024-03-11 ENCOUNTER — Other Ambulatory Visit: Payer: Self-pay

## 2024-03-11 DIAGNOSIS — I10 Essential (primary) hypertension: Secondary | ICD-10-CM

## 2024-03-11 MED ORDER — BENAZEPRIL HCL 20 MG PO TABS: 20.0000 mg | ORAL_TABLET | Freq: Every day | ORAL | 0 refills | Status: DC

## 2024-03-11 NOTE — Telephone Encounter (Signed)
 Copied from CRM [id number]. Topic: Clinical - Medication Question >> Mar 11, 2024 10:20 AM DeAngela L wrote: Reason for CRM: Patient has about 15 blood pressure pills and he had to reschedule physical his fathers funeral is the same day and would like to ask if he could get a small refill if he run out of medication before his appt  Pt number [phone] (M)

## 2024-03-18 ENCOUNTER — Encounter: Payer: Self-pay | Admitting: Nurse Practitioner

## 2024-03-24 ENCOUNTER — Other Ambulatory Visit: Payer: Self-pay | Admitting: Nurse Practitioner

## 2024-03-24 DIAGNOSIS — I10 Essential (primary) hypertension: Secondary | ICD-10-CM

## 2024-03-24 NOTE — Telephone Encounter (Signed)
 Copied from CRM [id number]. Topic: Clinical - Medication Refill >> Mar 24, 2024  2:47 PM Leory Rands wrote: Medication: benazepril  (LOTENSIN ) 20 MG tablet [[id number]] [facility]. Patient is calling to request medication to be sent to the below pharmacy  Has the patient contacted their pharmacy? Yes (Agent: If no, request that the patient contact the pharmacy for the refill. If patient does not wish to contact the pharmacy document the reason why and proceed with request.) (Agent: If yes, when and what did the pharmacy advise?)  This is the patient's preferred pharmacy:    [id number] [place], [place] - [address] [place] [address] Phone: [phone] Fax: [phone]  Is this the correct pharmacy for this prescription? Yes If no, delete pharmacy and type the correct one.   Has the prescription been filled recently? Yes  Is the patient out of the medication? Yes  Has the patient been seen for an appointment in the last year OR does the patient have an upcoming appointment? Yes  Can we respond through MyChart? Yes  Agent: Please be advised that Rx refills may take up to 3 business days. We ask that you follow-up with your pharmacy.

## 2024-04-04 ENCOUNTER — Encounter: Payer: Self-pay | Admitting: Nurse Practitioner

## 2024-04-04 ENCOUNTER — Ambulatory Visit (INDEPENDENT_AMBULATORY_CARE_PROVIDER_SITE_OTHER): Payer: Self-pay | Admitting: Nurse Practitioner

## 2024-04-04 VITALS — BP 155/87 | HR 74 | Ht 73.0 in | Wt 189.0 lb

## 2024-04-04 DIAGNOSIS — Z0000 Encounter for general adult medical examination without abnormal findings: Secondary | ICD-10-CM | POA: Diagnosis not present

## 2024-04-04 DIAGNOSIS — Z1211 Encounter for screening for malignant neoplasm of colon: Secondary | ICD-10-CM | POA: Diagnosis not present

## 2024-04-04 DIAGNOSIS — E7800 Pure hypercholesterolemia, unspecified: Secondary | ICD-10-CM | POA: Diagnosis not present

## 2024-04-04 DIAGNOSIS — I10 Essential (primary) hypertension: Secondary | ICD-10-CM

## 2024-04-04 MED ORDER — ATORVASTATIN CALCIUM 40 MG PO TABS: 40.0000 mg | ORAL_TABLET | Freq: Every day | ORAL | 1 refills | Status: DC

## 2024-04-04 MED ORDER — BENAZEPRIL HCL 40 MG PO TABS: 40.0000 mg | ORAL_TABLET | Freq: Every day | ORAL | 1 refills | Status: DC

## 2024-04-04 NOTE — Assessment & Plan Note (Signed)
Chronic.  Controlled.  Continue with current medication regimen of Atorvastatin 40mg.  Refills sent today. Labs ordered today.  Return to clinic in 6 months for reevaluation.  Call sooner if concerns arise.   

## 2024-04-04 NOTE — Assessment & Plan Note (Signed)
 Chronic.  Not well controlled.  Will increase Benazepril  to 40mg  daily.  Refills sent today.  Labs ordered.  Follow up in 6 months.  Call sooner if concerns arise.

## 2024-04-04 NOTE — Progress Notes (Signed)
 BP (!) 155/87   Pulse 74   Ht 6' 1 (1.854 m)   Wt 189 lb (85.7 kg)   BMI 24.94 kg/m    Subjective:    Patient ID: Cameron Nelson, male    DOB: 04-13-1966, 58 y.o.   MRN: [id number]  HPI: Cameron Nelson is a 58 y.o. male presenting on 04/04/2024 for comprehensive medical examination. Current medical complaints include:none  He currently lives with: Interim Problems from his last visit: no  HYPERTENSION / HYPERLIPIDEMIA Satisfied with current treatment? no Duration of hypertension: years BP monitoring frequency: daily BP range: 130-160/80 BP medication side effects: no Past BP meds: benazepril  Duration of hyperlipidemia: years Cholesterol medication side effects: no Cholesterol supplements: none Past cholesterol medications: atorvastain (lipitor) Medication compliance: excellent compliance Aspirin: yes Recent stressors: no Recurrent headaches: no Visual changes: no Palpitations: no Dyspnea: no Chest pain: no Lower extremity edema: no Dizzy/lightheaded: no  Patient states he has a workman's comp case for his left shoulder.     Depression Screen done today and results listed below:     04/04/2024   10:39 AM 09/14/2023   10:35 AM 02/22/2023    8:31 AM 06/15/2022    8:09 AM 08/30/2021    8:15 AM  Depression screen PHQ 2/9  Decreased Interest 0 0 1 1 0  Down, Depressed, Hopeless 0 0 1 2 0  PHQ - 2 Score 0 0 2 3 0  Altered sleeping 1 0 2 0 0  Tired, decreased energy 0 0 1 1 0  Change in appetite 0 1 2 1  0  Feeling bad or failure about yourself  0 0 1 1 0  Trouble concentrating 0 0 1 1 0  Moving slowly or fidgety/restless 0 0 0 0 0  Suicidal thoughts 0 0 0 0 0  PHQ-9 Score 1 1 9 7  0  Difficult doing work/chores Not difficult at all  Somewhat difficult Somewhat difficult Not difficult at all    The patient does not have a history of falls. I did complete a risk assessment for falls. A plan of care for falls was documented.   Past Medical History:  Past Medical  History:  Diagnosis Date   Anxiety    Bursitis of right shoulder 11/13/2018   Depression    Hyperlipidemia    Hypertension     Surgical History:  Past Surgical History:  Procedure Laterality Date   JOINT REPLACEMENT     TONSILLECTOMY     TOTAL HIP ARTHROPLASTY Left 12/27/2012   Procedure: TOTAL HIP ARTHROPLASTY ANTERIOR APPROACH;  Surgeon: Aurther Blue, MD;  Location: [facility] OR;  Service: Orthopedics;  Laterality: Left;    Medications:  Current Outpatient Medications on File Prior to Visit  Medication Sig   aspirin EC 81 MG tablet Take 81 mg by mouth daily. Swallow whole.   predniSONE (STERAPRED UNI-PAK 21 TAB) 5 MG (21) TBPK tablet SMARTSIG:- Tablet(s) By Mouth -   No current facility-administered medications on file prior to visit.    Allergies:  No Known Allergies  Social History:  Social History   Socioeconomic History   Marital status: Divorced    Spouse name: Not on file   Number of children: Not on file   Years of education: Not on file   Highest education level: Master's degree (e.g., MA, MS, MEng, MEd, MSW, MBA)  Occupational History   Not on file  Tobacco Use   Smoking status: Former    Current packs/day: 0.00    Average  packs/day: 0.5 packs/day for 3.0 years (1.5 ttl pk-yrs)    Types: Cigarettes    Start date: 12/19/2001    Quit date: 12/19/2004    Years since quitting: 19.3   Smokeless tobacco: Never  Vaping Use   Vaping status: Never Used  Substance and Sexual Activity   Alcohol use: Yes   Drug use: No    Types: Marijuana   Sexual activity: Yes    Birth control/protection: None  Other Topics Concern   Not on file  Social History Narrative   Not on file   Social Drivers of Health   Financial Resource Strain: Low Risk  (08/29/2023)   Overall Financial Resource Strain (CARDIA)    Difficulty of Paying Living Expenses: Not hard at all  Food Insecurity: No Food Insecurity (08/29/2023)   Hunger Vital Sign    Worried About Running Out of Food in  the Last Year: Never true    Ran Out of Food in the Last Year: Never true  Transportation Needs: No Transportation Needs (08/29/2023)   PRAPARE - Administrator, Civil Service (Medical): No    Lack of Transportation (Non-Medical): No  Physical Activity: Sufficiently Active (08/29/2023)   Exercise Vital Sign    Days of Exercise per Week: 3 days    Minutes of Exercise per Session: 60 min  Stress: No Stress Concern Present (08/29/2023)   [employer] of Occupational Health - Occupational Stress Questionnaire    Feeling of Stress : Only a little  Social Connections: Socially Isolated (08/29/2023)   Social Connection and Isolation Panel    Frequency of Communication with Friends and Family: More than three times a week    Frequency of Social Gatherings with Friends and Family: More than three times a week    Attends Religious Services: Never    Database administrator or Organizations: No    Attends Engineer, structural: Not on file    Marital Status: Divorced  Catering manager Violence: Not on file   Social History   Tobacco Use  Smoking Status Former   Current packs/day: 0.00   Average packs/day: 0.5 packs/day for 3.0 years (1.5 ttl pk-yrs)   Types: Cigarettes   Start date: 12/19/2001   Quit date: 12/19/2004   Years since quitting: 19.3  Smokeless Tobacco Never   Social History   Substance and Sexual Activity  Alcohol Use Yes    Family History:  Family History  Problem Relation Age of Onset   Hyperlipidemia Mother    Cancer Father    Hypertension Father    Hyperlipidemia Father    Cancer Maternal Grandfather    Cancer Maternal Uncle     Past medical history, surgical history, medications, allergies, family history and social history reviewed with patient today and changes made to appropriate areas of the chart.   Review of Systems  Eyes:  Negative for blurred vision and double vision.  Respiratory:  Negative for shortness of breath.    Cardiovascular:  Negative for chest pain, palpitations and leg swelling.  Neurological:  Negative for dizziness and headaches.   All other ROS negative except what is listed above and in the HPI.      Objective:    BP (!) 155/87   Pulse 74   Ht 6' 1 (1.854 m)   Wt 189 lb (85.7 kg)   BMI 24.94 kg/m   Wt Readings from Last 3 Encounters:  04/04/24 189 lb (85.7 kg)  09/14/23 183 lb 9.6 oz (  83.3 kg)  02/22/23 182 lb 3.2 oz (82.6 kg)    Physical Exam Vitals and nursing note reviewed.  Constitutional:      General: He is not in acute distress.    Appearance: Normal appearance. He is not ill-appearing, toxic-appearing or diaphoretic.  HENT:     Head: Normocephalic.     Right Ear: Tympanic membrane, ear canal and external ear normal.     Left Ear: Tympanic membrane, ear canal and external ear normal.     Nose: Nose normal. No congestion or rhinorrhea.     Mouth/Throat:     Mouth: Mucous membranes are moist.   Eyes:     General:        Right eye: No discharge.        Left eye: No discharge.     Extraocular Movements: Extraocular movements intact.     Conjunctiva/sclera: Conjunctivae normal.     Pupils: Pupils are equal, round, and reactive to light.    Cardiovascular:     Rate and Rhythm: Normal rate and regular rhythm.     Heart sounds: No murmur heard. Pulmonary:     Effort: Pulmonary effort is normal. No respiratory distress.     Breath sounds: Normal breath sounds. No wheezing, rhonchi or rales.  Abdominal:     General: Abdomen is flat. Bowel sounds are normal. There is no distension.     Palpations: Abdomen is soft.     Tenderness: There is no abdominal tenderness. There is no guarding.   Musculoskeletal:     Cervical back: Normal range of motion and neck supple.   Skin:    General: Skin is warm and dry.     Capillary Refill: Capillary refill takes less than 2 seconds.   Neurological:     General: No focal deficit present.     Mental Status: He is alert and  oriented to person, place, and time.     Cranial Nerves: No cranial nerve deficit.     Motor: No weakness.     Deep Tendon Reflexes: Reflexes normal.   Psychiatric:        Mood and Affect: Mood normal.        Behavior: Behavior normal.        Thought Content: Thought content normal.        Judgment: Judgment normal.     Results for orders placed or performed in visit on 09/14/23  Comp Met (CMET)   Collection Time: 09/14/23 10:46 AM  Result Value Ref Range   Glucose 77 70 - 99 mg/dL   BUN 5 (L) 6 - 24 mg/dL   Creatinine, Ser 1.61 (L) 0.76 - 1.27 mg/dL   eGFR 096 >04 VW/UJW/1.19   BUN/Creatinine Ratio 7 (L) 9 - 20   Sodium 139 134 - 144 mmol/L   Potassium 4.5 3.5 - 5.2 mmol/L   Chloride 100 96 - 106 mmol/L   CO2 25 20 - 29 mmol/L   Calcium  9.1 8.7 - 10.2 mg/dL   Total Protein 6.7 6.0 - 8.5 g/dL   Albumin 4.1 3.8 - 4.9 g/dL   Globulin, Total 2.6 1.5 - 4.5 g/dL   Bilirubin Total 0.2 0.0 - 1.2 mg/dL   Alkaline Phosphatase 67 44 - 121 IU/L   AST 30 0 - 40 IU/L   ALT 22 0 - 44 IU/L  Lipid Profile   Collection Time: 09/14/23 10:46 AM  Result Value Ref Range   Cholesterol, Total 167 100 - 199 mg/dL  Triglycerides 128 0 - 149 mg/dL   HDL 91 >09 mg/dL   VLDL Cholesterol Cal 21 5 - 40 mg/dL   LDL Chol Calc (NIH) 55 0 - 99 mg/dL   Chol/HDL Ratio 1.8 0.0 - 5.0 ratio      Assessment & Plan:   Problem List Items Addressed This Visit       Cardiovascular and Mediastinum   Hypertension   Chronic.  Not well controlled.  Will increase Benazepril  to 40mg  daily.  Refills sent today.  Labs ordered.  Follow up in 6 months.  Call sooner if concerns arise.       Relevant Medications   benazepril  (LOTENSIN ) 40 MG tablet   atorvastatin  (LIPITOR) 40 MG tablet     Other   Hyperlipidemia   Chronic.  Controlled.  Continue with current medication regimen of Atorvastatin  40mg .  Refills sent today.  Labs ordered today.  Return to clinic in 6 months for reevaluation.  Call sooner if  concerns arise.        Relevant Medications   benazepril  (LOTENSIN ) 40 MG tablet   atorvastatin  (LIPITOR) 40 MG tablet   Other Relevant Orders   Lipid panel   Other Visit Diagnoses       Annual physical exam    -  Primary   Health maintenance reviewed during visit today.  Labs ordered.  Vaccines reviewed.  Colonoscopy ordered.   Relevant Orders   TSH   PSA   Lipid panel   CBC with Differential/Platelet   Comprehensive metabolic panel with GFR     Screening for colon cancer       Relevant Orders   Ambulatory referral to Gastroenterology        Discussed aspirin prophylaxis for myocardial infarction prevention and decision was made to continue ASA  LABORATORY TESTING:  Health maintenance labs ordered today as discussed above.   The natural history of prostate cancer and ongoing controversy regarding screening and potential treatment outcomes of prostate cancer has been discussed with the patient. The meaning of a false positive PSA and a false negative PSA has been discussed. He indicates understanding of the limitations of this screening test and wishes to proceed with screening PSA testing.   IMMUNIZATIONS:   - Tdap: Tetanus vaccination status reviewed: last tetanus booster within 10 years. - Influenza: Postponed to flu season - Pneumovax: Not applicable - Prevnar: Not applicable - COVID: Not applicable - HPV: Not applicable - Shingrix vaccine: Discussed at visit today  SCREENING: - Colonoscopy: Up to date  Discussed with patient purpose of the colonoscopy is to detect colon cancer at curable precancerous or early stages   - AAA Screening: Not applicable  -Hearing Test: Not applicable  -Spirometry: Not applicable   PATIENT COUNSELING:    Sexuality: Discussed sexually transmitted diseases, partner selection, use of condoms, avoidance of unintended pregnancy  and contraceptive alternatives.   Advised to avoid cigarette smoking.  I discussed with the patient  that most people either abstain from alcohol or drink within safe limits (<=14/week and <=4 drinks/occasion for males, <=7/weeks and <= 3 drinks/occasion for females) and that the risk for alcohol disorders and other health effects rises proportionally with the number of drinks per week and how often a drinker exceeds daily limits.  Discussed cessation/primary prevention of drug use and availability of treatment for abuse.   Diet: Encouraged to adjust caloric intake to maintain  or achieve ideal body weight, to reduce intake of dietary saturated fat and total fat, to  limit sodium intake by avoiding high sodium foods and not adding table salt, and to maintain adequate dietary potassium and calcium  preferably from fresh fruits, vegetables, and low-fat dairy products.    stressed the importance of regular exercise  Injury prevention: Discussed safety belts, safety helmets, smoke detector, smoking near bedding or upholstery.   Dental health: Discussed importance of regular tooth brushing, flossing, and dental visits.   Follow up plan: NEXT PREVENTATIVE PHYSICAL DUE IN 1 YEAR. No follow-ups on file.

## 2024-04-05 LAB — LIPID PANEL
Chol/HDL Ratio: 1.9 ratio (ref 0.0–5.0)
Cholesterol, Total: 183 mg/dL (ref 100–199)
HDL: 95 mg/dL (ref >39)
LDL Chol Calc (NIH): 68 mg/dL (ref 0–99)
Triglycerides: 118 mg/dL (ref 0–149)
VLDL Cholesterol Cal: 20 mg/dL (ref 5–40)

## 2024-04-05 LAB — CBC WITH DIFFERENTIAL/PLATELET
Basophils Absolute: 0 10*3/uL (ref 0.0–0.2)
Basos: 1 %
EOS (ABSOLUTE): 0 10*3/uL (ref 0.0–0.4)
Eos: 1 %
Hematocrit: 46.2 % (ref 37.5–51.0)
Hemoglobin: 15.3 g/dL (ref 13.0–17.7)
Immature Grans (Abs): 0 10*3/uL (ref 0.0–0.1)
Immature Granulocytes: 0 %
Lymphocytes Absolute: 1.1 10*3/uL (ref 0.7–3.1)
Lymphs: 17 %
MCH: 34.7 pg — ABNORMAL HIGH (ref 26.6–33.0)
MCHC: 33.1 g/dL (ref 31.5–35.7)
MCV: 105 fL — ABNORMAL HIGH (ref 79–97)
Monocytes Absolute: 0.6 10*3/uL (ref 0.1–0.9)
Monocytes: 10 %
Neutrophils Absolute: 4.5 10*3/uL (ref 1.4–7.0)
Neutrophils: 71 %
Platelets: 163 10*3/uL (ref 150–450)
RBC: 4.41 x10E6/uL (ref 4.14–5.80)
RDW: 13.5 % (ref 11.6–15.4)
WBC: 6.3 10*3/uL (ref 3.4–10.8)

## 2024-04-05 LAB — PSA: Prostate Specific Ag, Serum: 2.2 ng/mL (ref 0.0–4.0)

## 2024-04-05 LAB — COMPREHENSIVE METABOLIC PANEL WITH GFR
ALT: 30 IU/L (ref 0–44)
AST: 48 IU/L — ABNORMAL HIGH (ref 0–40)
Albumin: 4.3 g/dL (ref 3.8–4.9)
Alkaline Phosphatase: 90 IU/L (ref 44–121)
BUN/Creatinine Ratio: 7 — ABNORMAL LOW (ref 9–20)
BUN: 6 mg/dL (ref 6–24)
Bilirubin Total: 0.3 mg/dL (ref 0.0–1.2)
CO2: 22 mmol/L (ref 20–29)
Calcium: 8.9 mg/dL (ref 8.7–10.2)
Chloride: 96 mmol/L (ref 96–106)
Creatinine, Ser: 0.81 mg/dL (ref 0.76–1.27)
Globulin, Total: 2.4 g/dL (ref 1.5–4.5)
Glucose: 81 mg/dL (ref 70–99)
Potassium: 4.2 mmol/L (ref 3.5–5.2)
Sodium: 133 mmol/L — ABNORMAL LOW (ref 134–144)
Total Protein: 6.7 g/dL (ref 6.0–8.5)
eGFR: 104 mL/min/{1.73_m2} (ref >59)

## 2024-04-05 LAB — TSH: TSH: 1.23 u[IU]/mL (ref 0.450–4.500)

## 2024-04-07 ENCOUNTER — Ambulatory Visit: Payer: Self-pay | Admitting: Nurse Practitioner

## 2024-07-07 ENCOUNTER — Other Ambulatory Visit: Payer: Self-pay | Admitting: Nurse Practitioner

## 2024-07-07 DIAGNOSIS — I10 Essential (primary) hypertension: Secondary | ICD-10-CM

## 2024-07-09 NOTE — Telephone Encounter (Signed)
 No longer current dosing of this medication Requested Prescriptions  Pending Prescriptions Disp Refills   benazepril  (LOTENSIN ) 20 MG tablet [Pharmacy Med Name: BENAZEPRIL  HCL 20 MG TABLET] 90 tablet 0    Sig: TAKE 1 TABLET BY MOUTH DAILY. NEEDAPPT FOR FUTURE REFILLS     Cardiovascular:  ACE Inhibitors Failed - 07/09/2024 10:25 AM      Failed - Last BP in normal range    BP Readings from Last 1 Encounters:  04/04/24 (!) 155/87         Passed - Cr in normal range and within 180 days    Creatinine, Ser  Date Value Ref Range Status  04/04/2024 0.81 0.76 - 1.27 mg/dL Final         Passed - K in normal range and within 180 days    Potassium  Date Value Ref Range Status  04/04/2024 4.2 3.5 - 5.2 mmol/L Final         Passed - Patient is not pregnant      Passed - Valid encounter within last 6 months    Recent Outpatient Visits           3 months ago Annual physical exam   [facility] Melvin Pao, NP

## 2024-07-14 ENCOUNTER — Ambulatory Visit
Admission: RE | Admit: 2024-07-14 | Discharge: 2024-07-14 | Disposition: A | Discharge status: Home / Self Care | Attending: Gastroenterology | Admitting: Gastroenterology

## 2024-07-14 ENCOUNTER — Encounter: Payer: Self-pay | Admitting: Gastroenterology

## 2024-07-14 ENCOUNTER — Ambulatory Visit: Admitting: Anesthesiology

## 2024-07-14 ENCOUNTER — Encounter
Admission: RE | Disposition: A | Discharge status: Home / Self Care | Payer: Self-pay | Source: Home / Self Care | Attending: Gastroenterology

## 2024-07-14 DIAGNOSIS — Z79899 Other long term (current) drug therapy: Secondary | ICD-10-CM | POA: Insufficient documentation

## 2024-07-14 DIAGNOSIS — F419 Anxiety disorder, unspecified: Secondary | ICD-10-CM | POA: Diagnosis not present

## 2024-07-14 DIAGNOSIS — Z87891 Personal history of nicotine dependence: Secondary | ICD-10-CM | POA: Insufficient documentation

## 2024-07-14 DIAGNOSIS — I10 Essential (primary) hypertension: Secondary | ICD-10-CM | POA: Insufficient documentation

## 2024-07-14 DIAGNOSIS — D128 Benign neoplasm of rectum: Secondary | ICD-10-CM | POA: Diagnosis not present

## 2024-07-14 DIAGNOSIS — F32A Depression, unspecified: Secondary | ICD-10-CM | POA: Insufficient documentation

## 2024-07-14 DIAGNOSIS — Z604 Social exclusion and rejection: Secondary | ICD-10-CM | POA: Diagnosis not present

## 2024-07-14 DIAGNOSIS — Z1211 Encounter for screening for malignant neoplasm of colon: Secondary | ICD-10-CM | POA: Insufficient documentation

## 2024-07-14 DIAGNOSIS — D122 Benign neoplasm of ascending colon: Secondary | ICD-10-CM | POA: Diagnosis not present

## 2024-07-14 HISTORY — PX: POLYPECTOMY: SHX149

## 2024-07-14 HISTORY — PX: COLONOSCOPY: SHX5424

## 2024-07-14 SURGERY — COLONOSCOPY
Anesthesia: General

## 2024-07-14 MED ORDER — PROPOFOL 10 MG/ML IV BOLUS
INTRAVENOUS | Status: DC | PRN
  Administered 2024-07-14 (×2): 50 mg via INTRAVENOUS

## 2024-07-14 MED ORDER — LIDOCAINE HCL (PF) 2 % IJ SOLN
INTRAMUSCULAR | Status: AC
  Filled 2024-07-14: qty 5

## 2024-07-14 MED ORDER — DEXMEDETOMIDINE HCL IN NACL 80 MCG/20ML IV SOLN
INTRAVENOUS | Status: AC
  Filled 2024-07-14: qty 20

## 2024-07-14 MED ORDER — DEXMEDETOMIDINE HCL IN NACL 80 MCG/20ML IV SOLN
INTRAVENOUS | Status: DC | PRN
  Administered 2024-07-14: 8 ug via INTRAVENOUS
  Administered 2024-07-14: 12 ug via INTRAVENOUS

## 2024-07-14 MED ORDER — LIDOCAINE HCL (CARDIAC) PF 100 MG/5ML IV SOSY
PREFILLED_SYRINGE | INTRAVENOUS | Status: DC | PRN
  Administered 2024-07-14: 60 mg via INTRAVENOUS

## 2024-07-14 MED ORDER — PROPOFOL 500 MG/50ML IV EMUL
INTRAVENOUS | Status: DC | PRN
  Administered 2024-07-14: 75 ug/kg/min via INTRAVENOUS

## 2024-07-14 MED ORDER — PROPOFOL 1000 MG/100ML IV EMUL
INTRAVENOUS | Status: AC
  Filled 2024-07-14: qty 100

## 2024-07-14 MED ORDER — SODIUM CHLORIDE 0.9 % IV SOLN: INTRAVENOUS | Status: DC

## 2024-07-14 NOTE — Anesthesia Postprocedure Evaluation (Signed)
 Anesthesia Post Note  Patient: Cameron Nelson  Procedure(s) Performed: COLONOSCOPY POLYPECTOMY, INTESTINE  Patient location during evaluation: PACU Anesthesia Type: General Level of consciousness: awake and alert Pain management: pain level controlled Vital Signs Assessment: post-procedure vital signs reviewed and stable Respiratory status: spontaneous breathing, nonlabored ventilation, respiratory function stable and patient connected to nasal cannula oxygen Cardiovascular status: blood pressure returned to baseline and stable Postop Assessment: no apparent nausea or vomiting Anesthetic complications: no   No notable events documented.   Last Vitals:  Vitals:   07/14/24 0835 07/14/24 0845  BP: 121/81 134/86  Pulse: 65 64  Resp: 14 16  Temp:    SpO2: 100% 100%    Last Pain:  Vitals:   07/14/24 0845  TempSrc:   PainSc: 0-No pain                 Lynwood KANDICE Clause

## 2024-07-14 NOTE — H&P (Signed)
 [name] , MD [address], [place], [place], [address] Phone: [phone] Fax: [phone]  Primary Care Physician:  Melvin Pao, NP   Pre-Procedure History & Physical: HPI:  Cameron Nelson is a 58 y.o. male is here for an colonoscopy.   Past Medical History:  Diagnosis Date   Anxiety    Bursitis of right shoulder 11/13/2018   Depression    Hyperlipidemia    Hypertension     Past Surgical History:  Procedure Laterality Date   JOINT REPLACEMENT     TONSILLECTOMY     TOTAL HIP ARTHROPLASTY Left 12/27/2012   Procedure: TOTAL HIP ARTHROPLASTY ANTERIOR APPROACH;  Surgeon: Dempsey LULLA Moan, MD;  Location: [facility] OR;  Service: Orthopedics;  Laterality: Left;    Prior to Admission medications   Medication Sig Start Date End Date Taking? Authorizing Provider  aspirin EC 81 MG tablet Take 81 mg by mouth daily. Swallow whole.    [provider]  atorvastatin  (LIPITOR) 40 MG tablet Take 1 tablet (40 mg total) by mouth daily. 04/04/24   Melvin Pao, NP  benazepril  (LOTENSIN ) 40 MG tablet Take 1 tablet (40 mg total) by mouth daily. 04/04/24   Melvin Pao, NP  predniSONE (STERAPRED UNI-PAK 21 TAB) 5 MG (21) TBPK tablet SMARTSIG:- Tablet(s) By Mouth - 04/03/24   [provider]    Allergies as of 06/11/2024   (No Known Allergies)    Family History  Problem Relation Age of Onset   Hyperlipidemia Mother    Cancer Father    Hypertension Father    Hyperlipidemia Father    Cancer Maternal Grandfather    Cancer Maternal Uncle     Social History   Socioeconomic History   Marital status: Divorced    Spouse name: Not on file   Number of children: Not on file   Years of education: Not on file   Highest education level: Master's degree (e.g., MA, MS, MEng, MEd, MSW, MBA)  Occupational History   Not on file  Tobacco  Use   Smoking status: Former    Current packs/day: 0.00    Average packs/day: 0.5 packs/day for 3.0 years (1.5 ttl pk-yrs)    Types: Cigarettes    Start date: 12/19/2001    Quit date: 12/19/2004    Years since quitting: 19.5   Smokeless tobacco: Never  Vaping Use   Vaping status: Never Used  Substance and Sexual Activity   Alcohol use: Yes   Drug use: No    Types: Marijuana   Sexual activity: Yes    Birth control/protection: None  Other Topics Concern   Not on file  Social History Narrative   Not on file   Social Drivers of Health   Financial Resource Strain: Low Risk  (08/29/2023)   Overall Financial Resource Strain (CARDIA)    Difficulty of Paying Living Expenses: Not hard at all  Food Insecurity: No Food Insecurity (08/29/2023)   Hunger Vital Sign    Worried About Running Out of Food in the Last Year: Never true    Ran Out of Food in the Last Year: Never true  Transportation Needs: No Transportation Needs (08/29/2023)   PRAPARE - Administrator, Civil Service (Medical): No    Lack of Transportation (Non-Medical): No  Physical Activity: Sufficiently Active (08/29/2023)   Exercise Vital Sign    Days of Exercise per Week: 3 days    Minutes of Exercise per Session: 60 min  Stress: No Stress Concern  Present (08/29/2023)   [employer] of Occupational Health - Occupational Stress Questionnaire    Feeling of Stress : Only a little  Social Connections: Socially Isolated (08/29/2023)   Social Connection and Isolation Panel    Frequency of Communication with Friends and Family: More than three times a week    Frequency of Social Gatherings with Friends and Family: More than three times a week    Attends Religious Services: Never    Database administrator or Organizations: No    Attends Engineer, structural: Not on file    Marital Status: Divorced  Catering manager Violence: Not on file    Review of Systems: See HPI, otherwise negative ROS  Physical  Exam: BP (!) 150/96   Pulse 76   Temp (!) 96.6 F (35.9 C)   Resp 18   Ht 6' 1 (1.854 m)   Wt 80.7 kg   SpO2 100%   BMI 23.48 kg/m  General:   Alert,  pleasant and cooperative in NAD Head:  Normocephalic and atraumatic. Neck:  Supple; no masses or thyromegaly. Lungs:  Clear throughout to auscultation, normal respiratory effort.    Heart:  +S1, +S2, Regular rate and rhythm, No edema. Abdomen:  Soft, nontender and nondistended. Normal bowel sounds, without guarding, and without rebound.   Neurologic:  Alert and  oriented x4;  grossly normal neurologically.  Impression/Plan: Cameron Nelson is here for an colonoscopy to be performed for Screening colonoscopy average risk   Risks, benefits, limitations, and alternatives regarding  colonoscopy have been reviewed with the patient.  Questions have been answered.  All parties agreeable.   [name], MD  07/14/2024, 7:54 AM

## 2024-07-14 NOTE — Anesthesia Preprocedure Evaluation (Signed)
 Anesthesia Evaluation  Patient identified by MRN, date of birth, ID band Patient awake    Reviewed: Allergy & Precautions, H&P , NPO status , Patient's Chart, lab work & pertinent test results, reviewed documented beta blocker date and time   Airway Mallampati: II   Neck ROM: full    Dental  (+) Poor Dentition   Pulmonary neg pulmonary ROS, Patient abstained from smoking., former smoker   Pulmonary exam normal        Cardiovascular Exercise Tolerance: Poor hypertension, On Medications negative cardio ROS Normal cardiovascular exam Rhythm:regular Rate:Normal     Neuro/Psych  PSYCHIATRIC DISORDERS Anxiety Depression    negative neurological ROS     GI/Hepatic negative GI ROS, Neg liver ROS,,,  Endo/Other  negative endocrine ROS    Renal/GU negative Renal ROS  negative genitourinary   Musculoskeletal   Abdominal   Peds  Hematology negative hematology ROS (+)   Anesthesia Other Findings Past Medical History: No date: Anxiety 11/13/2018: Bursitis of right shoulder No date: Depression No date: Hyperlipidemia No date: Hypertension Past Surgical History: No date: JOINT REPLACEMENT No date: TONSILLECTOMY 12/27/2012: TOTAL HIP ARTHROPLASTY; Left     Comment:  Procedure: TOTAL HIP ARTHROPLASTY ANTERIOR APPROACH;                Surgeon: Dempsey LULLA Moan, MD;  Location: [facility] OR;  Service:              Orthopedics;  Laterality: Left; BMI    Body Mass Index: 23.48 kg/m     Reproductive/Obstetrics negative OB ROS                              Anesthesia Physical Anesthesia Plan  ASA: 2  Anesthesia Plan: General   Post-op Pain Management:    Induction:   PONV Risk Score and Plan:   Airway Management Planned:   Additional Equipment:   Intra-op Plan:   Post-operative Plan:   Informed Consent: I have reviewed the patients History and Physical, chart, labs and discussed the procedure  including the risks, benefits and alternatives for the proposed anesthesia with the patient or authorized representative who has indicated his/her understanding and acceptance.     Dental Advisory Given  Plan Discussed with: CRNA  Anesthesia Plan Comments:         Anesthesia Quick Evaluation

## 2024-07-14 NOTE — Op Note (Signed)
 [facility] Gastroenterology Patient Name: Cameron Nelson Procedure Date: 07/14/2024 8:01 AM MRN: [id number] Account #: [id number] Date of Birth: Dec 16, 1965 Admit Type: Outpatient Age: 58 Room: [facility] ENDO ROOM 1 Gender: Male Note Status: Finalized Instrument Name: Colon Scope [id number] Procedure:             Colonoscopy Indications:           Screening for colorectal malignant neoplasm Providers:             [name] MD, MD Referring MD:          Darice Petty (Referring MD) Medicines:             Monitored Anesthesia Care Complications:         No immediate complications. Procedure:             Pre-Anesthesia Assessment:                        - Prior to the procedure, a History and Physical was                         performed, and patient medications, allergies and                         sensitivities were reviewed. The patient's tolerance                         of previous anesthesia was reviewed.                        - The risks and benefits of the procedure and the                         sedation options and risks were discussed with the                         patient. All questions were answered and informed                         consent was obtained.                        - ASA Grade Assessment: II - A patient with mild                         systemic disease.                        After obtaining informed consent, the colonoscope was                         passed under direct vision. Throughout the procedure,                         the patient's blood pressure, pulse, and oxygen                         saturations were monitored continuously. The                         Colonoscope was introduced  through the anus and                         advanced to the the cecum, identified by the                         appendiceal orifice. The colonoscopy was performed                         with ease. The patient tolerated the procedure well.                          The quality of the bowel preparation was adequate. The                         ileocecal valve, appendiceal orifice, and rectum were                         photographed. Findings:      The perianal and digital rectal examinations were normal.      Two sessile polyps were found in the ascending colon. The polyps were 4       to 5 mm in size. These polyps were removed with a cold snare. Resection       was complete, but the polyp tissue was only partially retrieved.      Two sessile polyps were found in the rectum. The polyps were 4 to 5 mm       in size. These polyps were removed with a cold snare. Resection and       retrieval were complete.      The exam was otherwise without abnormality on direct and retroflexion       views. Impression:            - Two 4 to 5 mm polyps in the ascending colon, removed                         with a cold snare. Complete resection. Partial                         retrieval.                        - Two 4 to 5 mm polyps in the rectum, removed with a                         cold snare. Resected and retrieved.                        - The examination was otherwise normal on direct and                         retroflexion views. Recommendation:        - Discharge patient to home (with escort).                        - Resume previous diet.                        - Continue present medications.                        -  Await pathology results.                        - Repeat colonoscopy in 3 years for surveillance. Procedure Code(s):     --- Professional ---                        [phone], Colonoscopy, flexible; with removal of                         tumor(s), polyp(s), or other lesion(s) by snare                         technique Diagnosis Code(s):     --- Professional ---                        Z12.11, Encounter for screening for malignant neoplasm                         of colon                        D12.2, Benign neoplasm of  ascending colon                        D12.8, Benign neoplasm of rectum CPT copyright 2022 American Medical Association. All rights reserved. The codes documented in this report are preliminary and upon coder review may  be revised to meet current compliance requirements. [name], MD [name] MD, MD 07/14/2024 8:24:32 AM This report has been signed electronically. Number of Addenda: 0 Note Initiated On: 07/14/2024 8:01 AM Scope Withdrawal Time: 0 hours 11 minutes 26 seconds  Total Procedure Duration: 0 hours 14 minutes 45 seconds  Estimated Blood Loss:  Estimated blood loss: none.      [facility]

## 2024-07-14 NOTE — Transfer of Care (Signed)
 Immediate Anesthesia Transfer of Care Note  Patient: Cameron Nelson  Procedure(s) Performed: COLONOSCOPY POLYPECTOMY, INTESTINE  Patient Location: PACU  Anesthesia Type:General  Level of Consciousness: sedated  Airway & Oxygen Therapy: Patient Spontanous Breathing  Post-op Assessment: Report given to RN and Post -op Vital signs reviewed and stable  Post vital signs: Reviewed and stable  Last Vitals:  Vitals Value Taken Time  BP 102/72 07/14/24 08:25  Temp    Pulse 62 07/14/24 08:25  Resp 12 07/14/24 08:25  SpO2 100 % 07/14/24 08:25  Vitals shown include unfiled device data.  Last Pain:  Vitals:   07/14/24 0744  PainSc: 7          Complications: No notable events documented.

## 2024-07-16 LAB — SURGICAL PATHOLOGY

## 2024-09-24 ENCOUNTER — Telehealth (HOSPITAL_BASED_OUTPATIENT_CLINIC_OR_DEPARTMENT_OTHER): Payer: Self-pay | Admitting: Orthopaedic Surgery

## 2024-09-24 NOTE — Telephone Encounter (Signed)
 Patient wants to know when he can come in eariler because he is in a lot of pain and he can get relief until he sees Dr B with any meds or anything

## 2024-09-30 ENCOUNTER — Other Ambulatory Visit: Payer: Self-pay | Admitting: Nurse Practitioner

## 2024-09-30 DIAGNOSIS — E7800 Pure hypercholesterolemia, unspecified: Secondary | ICD-10-CM

## 2024-10-01 ENCOUNTER — Ambulatory Visit (HOSPITAL_BASED_OUTPATIENT_CLINIC_OR_DEPARTMENT_OTHER): Admitting: Orthopaedic Surgery

## 2024-10-01 NOTE — Telephone Encounter (Signed)
 Requested Prescriptions  Pending Prescriptions Disp Refills   atorvastatin  (LIPITOR) 40 MG tablet [Pharmacy Med Name: ATORVASTATIN  40 MG TABLET] 90 tablet 0    Sig: TAKE 1 TABLET BY MOUTH EVERY DAY     Cardiovascular:  Antilipid - Statins Failed - 10/01/2024  4:27 PM      Failed - Lipid Panel in normal range within the last 12 months    Cholesterol, Total  Date Value Ref Range Status  04/04/2024 183 100 - 199 mg/dL Final   Cholesterol Piccolo, Waived  Date Value Ref Range Status  11/13/2018 175 <200 mg/dL Final    Comment:                            Desirable                <200                         Borderline High      200- 239                         High                     >239    LDL Chol Calc (NIH)  Date Value Ref Range Status  04/04/2024 68 0 - 99 mg/dL Final   HDL  Date Value Ref Range Status  04/04/2024 95 >39 mg/dL Final   Triglycerides  Date Value Ref Range Status  04/04/2024 118 0 - 149 mg/dL Final   Triglycerides Piccolo,Waived  Date Value Ref Range Status  11/13/2018 391 (H) <150 mg/dL Final    Comment:                            Normal                   <150                         Borderline High     150 - 199                         High                200 - 499                         Very High                >499          Passed - Patient is not pregnant      Passed - Valid encounter within last 12 months    Recent Outpatient Visits           6 months ago Annual physical exam   [facility] Melvin Pao, NP       Future Appointments             In 1 week Genelle Standing, MD [facility] at [facility], [place]   In 1 month Genelle Standing, MD [facility] at [facility], [place]

## 2024-10-03 ENCOUNTER — Other Ambulatory Visit: Payer: Self-pay | Admitting: Nurse Practitioner

## 2024-10-03 DIAGNOSIS — I10 Essential (primary) hypertension: Secondary | ICD-10-CM

## 2024-10-06 NOTE — Telephone Encounter (Signed)
 Requested medications are due for refill today.  yes  Requested medications are on the active medications list.  yes  Last refill. 04/04/2024 #90 1 rf  Future visit scheduled.   yes  Notes to clinic.  Labs are expired.    Requested Prescriptions  Pending Prescriptions Disp Refills   benazepril  (LOTENSIN ) 40 MG tablet [Pharmacy Med Name: BENAZEPRIL  HCL 40 MG TABLET] 90 tablet 1    Sig: TAKE 1 TABLET BY MOUTH EVERY DAY     Cardiovascular:  ACE Inhibitors Failed - 10/06/2024  2:09 PM      Failed - Cr in normal range and within 180 days    Creatinine, Ser  Date Value Ref Range Status  04/04/2024 0.81 0.76 - 1.27 mg/dL Final         Failed - K in normal range and within 180 days    Potassium  Date Value Ref Range Status  04/04/2024 4.2 3.5 - 5.2 mmol/L Final         Failed - Valid encounter within last 6 months    Recent Outpatient Visits           6 months ago Annual physical exam   [facility] Melvin Pao, NP       Future Appointments             In 2 days Genelle Standing, MD [facility] at [facility], [place]   In 3 weeks Genelle Standing, MD [facility] at [facility], [place]            Passed - Patient is not pregnant      Passed - Last BP in normal range    BP Readings from Last 1 Encounters:  07/14/24 134/86

## 2024-10-08 ENCOUNTER — Ambulatory Visit (INDEPENDENT_AMBULATORY_CARE_PROVIDER_SITE_OTHER): Payer: Self-pay | Admitting: Orthopaedic Surgery

## 2024-10-08 ENCOUNTER — Ambulatory Visit (HOSPITAL_BASED_OUTPATIENT_CLINIC_OR_DEPARTMENT_OTHER): Payer: Self-pay | Admitting: Orthopaedic Surgery

## 2024-10-08 DIAGNOSIS — S46011A Strain of muscle(s) and tendon(s) of the rotator cuff of right shoulder, initial encounter: Secondary | ICD-10-CM
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
## 2024-10-10 ENCOUNTER — Ambulatory Visit: Admitting: Nurse Practitioner

## 2024-10-10 ENCOUNTER — Encounter: Payer: Self-pay | Admitting: Nurse Practitioner

## 2024-10-10 VITALS — BP 135/85 | HR 79 | Temp 98.0°F | Ht 72.99 in | Wt 190.4 lb

## 2024-10-10 DIAGNOSIS — I10 Essential (primary) hypertension: Secondary | ICD-10-CM

## 2024-10-10 DIAGNOSIS — E7800 Pure hypercholesterolemia, unspecified: Secondary | ICD-10-CM

## 2024-10-10 MED ORDER — ATORVASTATIN CALCIUM 40 MG PO TABS: 40.0000 mg | ORAL_TABLET | Freq: Every day | ORAL | 1 refills | Status: AC

## 2024-10-10 MED ORDER — BENAZEPRIL HCL 40 MG PO TABS: 40.0000 mg | ORAL_TABLET | Freq: Every day | ORAL | 1 refills | Status: AC

## 2024-10-10 NOTE — Progress Notes (Signed)
 "  BP 135/85 (BP Location: Left Arm, Patient Position: Sitting, Cuff Size: Normal)   Pulse 79   Temp 98 F (36.7 C) (Oral)   Ht 6' 0.99 (1.854 m)   Wt 190 lb 6.4 oz (86.4 kg)   SpO2 99%   BMI 25.13 kg/m    Subjective:    Patient ID: Cameron Nelson, male    DOB: 1966/04/17, 58 y.o.   MRN: [id number]  HPI: Cameron Nelson is a 58 y.o. male  Chief Complaint  Patient presents with   office visit    6 month F/u   HYPERTENSION / HYPERLIPIDEMIA Satisfied with current treatment? no Duration of hypertension: years BP monitoring frequency: daily BP range: 135/80 BP medication side effects: no Past BP meds: benazepril  Duration of hyperlipidemia: years Cholesterol medication side effects: no Cholesterol supplements: none Past cholesterol medications: atorvastain (lipitor) Medication compliance: excellent compliance Aspirin: yes Recent stressors: no Recurrent headaches: no Visual changes: no Palpitations: no Dyspnea: no Chest pain: no Lower extremity edema: no Dizzy/lightheaded: no   Relevant past medical, surgical, family and social history reviewed and updated as indicated. Interim medical history since our last visit reviewed. Allergies and medications reviewed and updated.  Review of Systems  Eyes:  Negative for visual disturbance.  Respiratory:  Negative for shortness of breath.   Cardiovascular:  Negative for chest pain and leg swelling.  Neurological:  Negative for light-headedness and headaches.    Per HPI unless specifically indicated above     Objective:    BP 135/85 (BP Location: Left Arm, Patient Position: Sitting, Cuff Size: Normal)   Pulse 79   Temp 98 F (36.7 C) (Oral)   Ht 6' 0.99 (1.854 m)   Wt 190 lb 6.4 oz (86.4 kg)   SpO2 99%   BMI 25.13 kg/m   Wt Readings from Last 3 Encounters:  10/10/24 190 lb 6.4 oz (86.4 kg)  07/14/24 178 lb (80.7 kg)  04/04/24 189 lb (85.7 kg)    Physical Exam Vitals and nursing note reviewed.  Constitutional:       General: He is not in acute distress.    Appearance: Normal appearance. He is not ill-appearing, toxic-appearing or diaphoretic.  HENT:     Head: Normocephalic.     Right Ear: External ear normal.     Left Ear: External ear normal.     Nose: Nose normal. No congestion or rhinorrhea.     Mouth/Throat:     Mouth: Mucous membranes are moist.  Eyes:     General:        Right eye: No discharge.        Left eye: No discharge.     Extraocular Movements: Extraocular movements intact.     Conjunctiva/sclera: Conjunctivae normal.     Pupils: Pupils are equal, round, and reactive to light.  Cardiovascular:     Rate and Rhythm: Normal rate and regular rhythm.     Heart sounds: No murmur heard. Pulmonary:     Effort: Pulmonary effort is normal. No respiratory distress.     Breath sounds: Normal breath sounds. No wheezing, rhonchi or rales.  Abdominal:     General: Abdomen is flat. Bowel sounds are normal.  Musculoskeletal:     Cervical back: Normal range of motion and neck supple.  Skin:    General: Skin is warm and dry.     Capillary Refill: Capillary refill takes less than 2 seconds.  Neurological:     General: No focal deficit present.  Mental Status: He is alert and oriented to person, place, and time.  Psychiatric:        Mood and Affect: Mood normal.        Behavior: Behavior normal.        Thought Content: Thought content normal.        Judgment: Judgment normal.     Results for orders placed or performed during the hospital encounter of 07/14/24  Surgical pathology   Collection Time: 07/14/24 12:00 AM  Result Value Ref Range   SURGICAL PATHOLOGY      SURGICAL PATHOLOGY [facility] [address] [place], [place] [address] Telephone [phone] or [phone] Fax [phone]  REPORT OF SURGICAL PATHOLOGY   Accession #: [id number] Patient Name: Cameron Nelson Visit # : [id number]  MRN: [id number] Physician: Therisa Bi DOB/Age December 20, 1965 (Age: 55) Gender: M Collected Date: 07/14/2024 Received Date: 07/14/2024  FINAL DIAGNOSIS       1. Ascending  Colon Polyp, x2 cold snare :       BENIGN COLONIC MUCOSA WITH NO DIAGNOSTIC ABNORMALITY       2. Rectum, polyp(s), x2 cold snare :       HYPERPLASTIC POLYPS WITH FOCAL CHANGES OF MUCOSAL PROLAPSE      NEGATIVE FOR DYSPLASIA AND CARCINOMA       ELECTRONIC SIGNATURE : Cameron Nelson , Sports Administrator, International Aid/development Worker  MICROSCOPIC DESCRIPTION 1. -2.Deeper sections of both specimens are reviewed.  CASE COMMENTS STAINS USED IN DIAGNOSIS: H&E *RECUT DEEPER X 6 LEVELS *RECUT DEEPER X 6 LEVELS H&E *RECUT DEEPER X 6 LE VELS *RECUT DEEPER X 6 LEVELS    CLINICAL HISTORY  SPECIMEN(S) OBTAINED 1. Ascending  Colon Polyp, X2 Cold Snare 2. Rectum, polyp(s), X2 Cold Snare  SPECIMEN COMMENTS: SPECIMEN CLINICAL INFORMATION: 1. Screening for colon cancer, polyps    Gross Description 1. Received in formalin is a tan, soft tissue fragment that is submitted in toto.Size:  0.7 cm, 1 block submitted.no other tissue in container. 2. Received in formalin are tan, soft tissue fragments that are submitted in toto.Number:  two  Size:  0.9 cm, 1 block.mb 07-14-24        Report signed out from the following location(s) [facility] [address] [place], [place] [address] CLIA #: [id number]  [facility] [address] [place], [place] [address] CLIA #: [id number]       Assessment & Plan:   Problem List Items Addressed This Visit       Cardiovascular and Mediastinum   Hypertension   Chronic.  Controlled.  Continue with Benazepril  40mg  daily.  Refills sent today.  Labs ordered.  Follow up in 6 months.  Call sooner if concerns arise.       Relevant Medications   atorvastatin  (LIPITOR) 40 MG tablet   benazepril  (LOTENSIN ) 40 MG tablet   Other Relevant Orders   Comprehensive metabolic panel with GFR      Other   Hyperlipidemia - Primary   Chronic.  Controlled.  Continue with current medication regimen of Atorvastatin  40mg .  Refills sent today.  Labs ordered today.  Return to clinic in 6 months for reevaluation.  Call sooner if concerns arise.       Relevant Medications   atorvastatin  (LIPITOR) 40 MG tablet   benazepril  (LOTENSIN ) 40 MG tablet   Other Relevant Orders   Lipid panel     Follow up plan: No follow-ups on file.      "

## 2024-10-10 NOTE — Assessment & Plan Note (Signed)
 Chronic.  Controlled.  Continue with Benazepril  40mg  daily.  Refills sent today.  Labs ordered.  Follow up in 6 months.  Call sooner if concerns arise.

## 2024-10-10 NOTE — Assessment & Plan Note (Signed)
Chronic.  Controlled.  Continue with current medication regimen of Atorvastatin 40mg.  Refills sent today. Labs ordered today.  Return to clinic in 6 months for reevaluation.  Call sooner if concerns arise.   

## 2024-10-11 LAB — COMPREHENSIVE METABOLIC PANEL WITH GFR
ALT: 33 IU/L (ref 0–44)
AST: 49 IU/L — ABNORMAL HIGH (ref 0–40)
Albumin: 4.3 g/dL (ref 3.8–4.9)
Alkaline Phosphatase: 88 IU/L (ref 47–123)
BUN/Creatinine Ratio: 9 (ref 9–20)
BUN: 7 mg/dL (ref 6–24)
Bilirubin Total: 0.3 mg/dL (ref 0.0–1.2)
CO2: 23 mmol/L (ref 20–29)
Calcium: 9.1 mg/dL (ref 8.7–10.2)
Chloride: 97 mmol/L (ref 96–106)
Creatinine, Ser: 0.76 mg/dL (ref 0.76–1.27)
Globulin, Total: 2.2 g/dL (ref 1.5–4.5)
Glucose: 80 mg/dL (ref 70–99)
Potassium: 4.2 mmol/L (ref 3.5–5.2)
Sodium: 133 mmol/L — ABNORMAL LOW (ref 134–144)
Total Protein: 6.5 g/dL (ref 6.0–8.5)
eGFR: 105 mL/min/1.73

## 2024-10-11 LAB — LIPID PANEL
Chol/HDL Ratio: 1.9 ratio (ref 0.0–5.0)
Cholesterol, Total: 169 mg/dL (ref 100–199)
HDL: 87 mg/dL
LDL Chol Calc (NIH): 58 mg/dL (ref 0–99)
Triglycerides: 146 mg/dL (ref 0–149)
VLDL Cholesterol Cal: 24 mg/dL (ref 5–40)

## 2024-10-13 ENCOUNTER — Ambulatory Visit: Payer: Self-pay | Admitting: Nurse Practitioner

## 2024-10-31 ENCOUNTER — Ambulatory Visit (INDEPENDENT_AMBULATORY_CARE_PROVIDER_SITE_OTHER): Payer: Self-pay | Admitting: Orthopaedic Surgery

## 2024-10-31 DIAGNOSIS — M75112 Incomplete rotator cuff tear or rupture of left shoulder, not specified as traumatic: Secondary | ICD-10-CM
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
## 2025-04-14 ENCOUNTER — Ambulatory Visit: Admitting: Nurse Practitioner
